# Patient Record
Sex: Female | Born: 1965 | Race: White | Hispanic: No | Marital: Single | Smoking: Current every day smoker
Health system: Southern US, Community
[De-identification: ages and names within clinical notes are randomized; demographics above are authoritative.]

## PROBLEM LIST (undated history)

## (undated) DIAGNOSIS — C539 Malignant neoplasm of cervix uteri, unspecified: Secondary | ICD-10-CM

## (undated) DIAGNOSIS — G8929 Other chronic pain: Secondary | ICD-10-CM

## (undated) DIAGNOSIS — J381 Polyp of vocal cord and larynx: Secondary | ICD-10-CM

## (undated) DIAGNOSIS — K219 Gastro-esophageal reflux disease without esophagitis: Secondary | ICD-10-CM

## (undated) DIAGNOSIS — M5030 Other cervical disc degeneration, unspecified cervical region: Secondary | ICD-10-CM

## (undated) DIAGNOSIS — I639 Cerebral infarction, unspecified: Secondary | ICD-10-CM

## (undated) DIAGNOSIS — R498 Other voice and resonance disorders: Secondary | ICD-10-CM

## (undated) DIAGNOSIS — C55 Malignant neoplasm of uterus, part unspecified: Secondary | ICD-10-CM

## (undated) DIAGNOSIS — G629 Polyneuropathy, unspecified: Secondary | ICD-10-CM

## (undated) DIAGNOSIS — Z87448 Personal history of other diseases of urinary system: Secondary | ICD-10-CM

## (undated) DIAGNOSIS — M797 Fibromyalgia: Secondary | ICD-10-CM

## (undated) DIAGNOSIS — C50919 Malignant neoplasm of unspecified site of unspecified female breast: Secondary | ICD-10-CM

## (undated) DIAGNOSIS — F419 Anxiety disorder, unspecified: Secondary | ICD-10-CM

## (undated) DIAGNOSIS — M1990 Unspecified osteoarthritis, unspecified site: Secondary | ICD-10-CM

## (undated) DIAGNOSIS — K529 Noninfective gastroenteritis and colitis, unspecified: Secondary | ICD-10-CM

## (undated) DIAGNOSIS — I10 Essential (primary) hypertension: Secondary | ICD-10-CM

## (undated) DIAGNOSIS — B1920 Unspecified viral hepatitis C without hepatic coma: Secondary | ICD-10-CM

## (undated) DIAGNOSIS — M5136 Other intervertebral disc degeneration, lumbar region: Secondary | ICD-10-CM

## (undated) HISTORY — DX: Unspecified viral hepatitis C without hepatic coma: B19.20

## (undated) HISTORY — PX: RADICAL ABDOMINAL HYSTERECTOMY: SUR659

## (undated) HISTORY — PX: SHOULDER SURGERY: SHX246

## (undated) HISTORY — DX: Other voice and resonance disorders: R49.8

## (undated) HISTORY — PX: OTHER SURGICAL HISTORY: SHX169

## (undated) HISTORY — PX: TONSILLECTOMY AND ADENOIDECTOMY: SHX28

## (undated) HISTORY — PX: BACK SURGERY: SHX140

## (undated) HISTORY — DX: Polyp of vocal cord and larynx: J38.1

## (undated) HISTORY — PX: NECK SURGERY: SHX720

## (undated) HISTORY — DX: Essential (primary) hypertension: I10

## (undated) HISTORY — PX: CARPAL TUNNEL RELEASE: SHX101

## (undated) HISTORY — DX: Cerebral infarction, unspecified: I63.9

## (undated) HISTORY — PX: TONSILLECTOMY: SUR1361

## (undated) HISTORY — DX: Anxiety disorder, unspecified: F41.9

## (undated) HISTORY — DX: Unspecified osteoarthritis, unspecified site: M19.90

## (undated) HISTORY — DX: Gastro-esophageal reflux disease without esophagitis: K21.9

## (undated) HISTORY — PX: BREAST BIOPSY: SHX20

---

## 2004-10-30 ENCOUNTER — Emergency Department (HOSPITAL_COMMUNITY): Admission: EM | Admit: 2004-10-30 | Discharge: 2004-10-30 | Payer: Self-pay | Admitting: Family Medicine

## 2005-12-18 ENCOUNTER — Ambulatory Visit: Payer: Self-pay | Admitting: Internal Medicine

## 2005-12-24 ENCOUNTER — Ambulatory Visit (HOSPITAL_COMMUNITY): Admission: RE | Admit: 2005-12-24 | Discharge: 2005-12-24 | Payer: Self-pay | Admitting: Internal Medicine

## 2006-01-12 ENCOUNTER — Encounter: Admission: RE | Admit: 2006-01-12 | Discharge: 2006-01-12 | Payer: Self-pay | Admitting: Internal Medicine

## 2006-01-19 ENCOUNTER — Ambulatory Visit: Payer: Self-pay | Admitting: Internal Medicine

## 2006-01-20 ENCOUNTER — Ambulatory Visit: Payer: Self-pay | Admitting: Internal Medicine

## 2006-01-21 ENCOUNTER — Ambulatory Visit: Payer: Self-pay | Admitting: *Deleted

## 2006-02-18 ENCOUNTER — Ambulatory Visit: Payer: Self-pay | Admitting: Internal Medicine

## 2006-03-01 ENCOUNTER — Ambulatory Visit (HOSPITAL_COMMUNITY): Admission: RE | Admit: 2006-03-01 | Discharge: 2006-03-01 | Payer: Self-pay | Admitting: Internal Medicine

## 2006-03-03 ENCOUNTER — Ambulatory Visit: Payer: Self-pay | Admitting: Internal Medicine

## 2006-03-09 ENCOUNTER — Ambulatory Visit: Payer: Self-pay | Admitting: Internal Medicine

## 2006-03-23 ENCOUNTER — Ambulatory Visit: Payer: Self-pay | Admitting: Internal Medicine

## 2006-03-24 ENCOUNTER — Ambulatory Visit (HOSPITAL_COMMUNITY): Admission: RE | Admit: 2006-03-24 | Discharge: 2006-03-24 | Payer: Self-pay | Admitting: Internal Medicine

## 2006-04-06 ENCOUNTER — Ambulatory Visit: Payer: Self-pay | Admitting: Internal Medicine

## 2006-05-19 ENCOUNTER — Ambulatory Visit: Payer: Self-pay | Admitting: Nurse Practitioner

## 2006-06-23 ENCOUNTER — Ambulatory Visit: Payer: Self-pay | Admitting: Nurse Practitioner

## 2006-07-20 ENCOUNTER — Ambulatory Visit: Payer: Self-pay | Admitting: Nurse Practitioner

## 2006-07-21 ENCOUNTER — Encounter (INDEPENDENT_AMBULATORY_CARE_PROVIDER_SITE_OTHER): Payer: Self-pay | Admitting: Nurse Practitioner

## 2006-07-26 ENCOUNTER — Ambulatory Visit (HOSPITAL_COMMUNITY): Admission: RE | Admit: 2006-07-26 | Discharge: 2006-07-26 | Payer: Self-pay | Admitting: Family Medicine

## 2006-08-10 ENCOUNTER — Encounter: Admission: RE | Admit: 2006-08-10 | Discharge: 2006-08-26 | Payer: Self-pay | Admitting: Neurosurgery

## 2006-08-18 ENCOUNTER — Other Ambulatory Visit: Admission: RE | Admit: 2006-08-18 | Discharge: 2006-08-18 | Payer: Self-pay | Admitting: Nurse Practitioner

## 2006-08-18 ENCOUNTER — Ambulatory Visit: Payer: Self-pay | Admitting: Nurse Practitioner

## 2006-09-01 ENCOUNTER — Emergency Department (HOSPITAL_COMMUNITY): Admission: EM | Admit: 2006-09-01 | Discharge: 2006-09-01 | Payer: Self-pay | Admitting: Family Medicine

## 2006-09-21 ENCOUNTER — Ambulatory Visit: Payer: Self-pay | Admitting: Nurse Practitioner

## 2006-10-13 ENCOUNTER — Ambulatory Visit: Payer: Self-pay | Admitting: Nurse Practitioner

## 2006-11-15 ENCOUNTER — Ambulatory Visit: Payer: Self-pay | Admitting: Nurse Practitioner

## 2006-12-16 ENCOUNTER — Ambulatory Visit: Payer: Self-pay | Admitting: Nurse Practitioner

## 2007-01-13 ENCOUNTER — Ambulatory Visit: Payer: Self-pay | Admitting: Nurse Practitioner

## 2007-02-18 ENCOUNTER — Ambulatory Visit: Payer: Self-pay | Admitting: Nurse Practitioner

## 2007-03-15 ENCOUNTER — Ambulatory Visit: Payer: Self-pay | Admitting: Nurse Practitioner

## 2007-04-13 ENCOUNTER — Ambulatory Visit: Payer: Self-pay | Admitting: Internal Medicine

## 2007-04-25 ENCOUNTER — Ambulatory Visit (HOSPITAL_COMMUNITY): Admission: RE | Admit: 2007-04-25 | Discharge: 2007-04-25 | Payer: Self-pay | Admitting: Nurse Practitioner

## 2007-06-14 ENCOUNTER — Encounter: Payer: Self-pay | Admitting: Nurse Practitioner

## 2007-06-14 DIAGNOSIS — F32A Depression, unspecified: Secondary | ICD-10-CM | POA: Insufficient documentation

## 2007-06-14 DIAGNOSIS — K219 Gastro-esophageal reflux disease without esophagitis: Secondary | ICD-10-CM | POA: Insufficient documentation

## 2007-06-14 DIAGNOSIS — F329 Major depressive disorder, single episode, unspecified: Secondary | ICD-10-CM

## 2007-06-14 DIAGNOSIS — Z8739 Personal history of other diseases of the musculoskeletal system and connective tissue: Secondary | ICD-10-CM | POA: Insufficient documentation

## 2007-06-14 DIAGNOSIS — IMO0002 Reserved for concepts with insufficient information to code with codable children: Secondary | ICD-10-CM | POA: Insufficient documentation

## 2007-06-14 DIAGNOSIS — M542 Cervicalgia: Secondary | ICD-10-CM | POA: Insufficient documentation

## 2007-07-06 ENCOUNTER — Ambulatory Visit: Payer: Self-pay | Admitting: Internal Medicine

## 2007-08-04 ENCOUNTER — Ambulatory Visit: Payer: Self-pay | Admitting: Internal Medicine

## 2007-12-16 ENCOUNTER — Ambulatory Visit: Payer: Self-pay | Admitting: Family Medicine

## 2008-01-13 ENCOUNTER — Emergency Department (HOSPITAL_COMMUNITY): Admission: EM | Admit: 2008-01-13 | Discharge: 2008-01-13 | Payer: Self-pay | Admitting: Emergency Medicine

## 2008-02-01 ENCOUNTER — Ambulatory Visit: Payer: Self-pay | Admitting: Family Medicine

## 2008-03-31 ENCOUNTER — Emergency Department (HOSPITAL_COMMUNITY): Admission: EM | Admit: 2008-03-31 | Discharge: 2008-04-01 | Payer: Self-pay | Admitting: Emergency Medicine

## 2008-05-07 ENCOUNTER — Ambulatory Visit: Payer: Self-pay | Admitting: Internal Medicine

## 2008-06-10 ENCOUNTER — Emergency Department (HOSPITAL_COMMUNITY): Admission: EM | Admit: 2008-06-10 | Discharge: 2008-06-10 | Payer: Self-pay | Admitting: Emergency Medicine

## 2008-06-20 IMAGING — US US ABDOMEN COMPLETE
1 series · 14 of 25 positions shown · non-contrast
Comparison: None.

CLINICAL DATA: 40 year old with epigastric/abdominal pain.
 ABDOMEN ULTRASOUND COMPLETE ? 07/26/06:
TECHNIQUE: Complete abdominal ultrasound examination was performed including evaluation of the liver, gallbladder, bile ducts, pancreas, kidneys, spleen, IVC, and abdominal aorta.

[Series 1: unknown · 0.38mm/px · 14 of 102 slices shown]
[im 1/102]
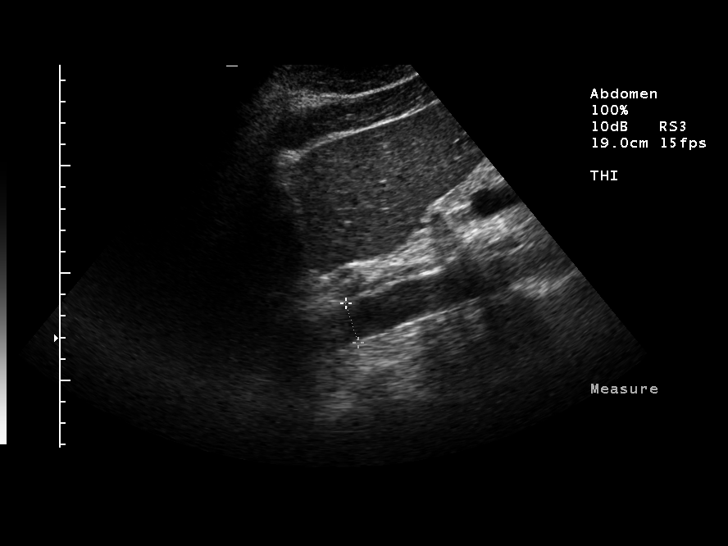
[im 9/102]
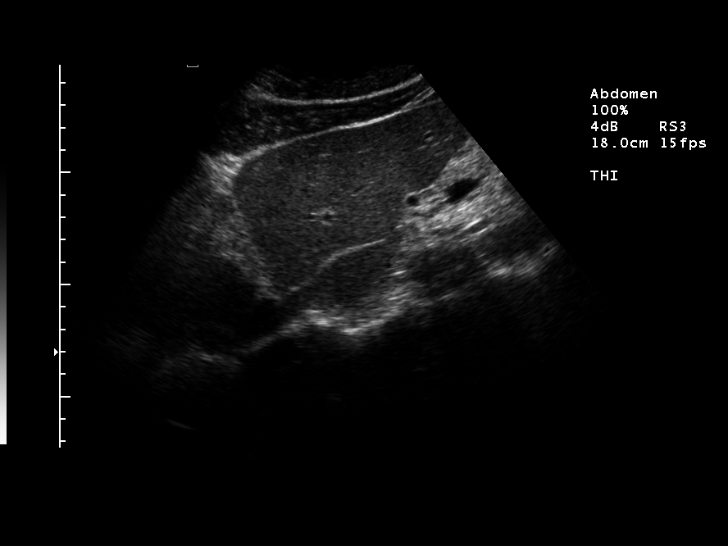
[im 17/102]
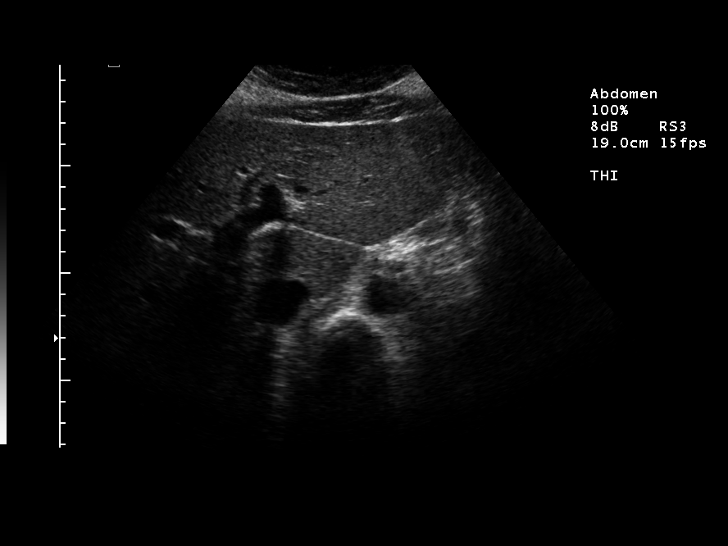
[im 26/102]
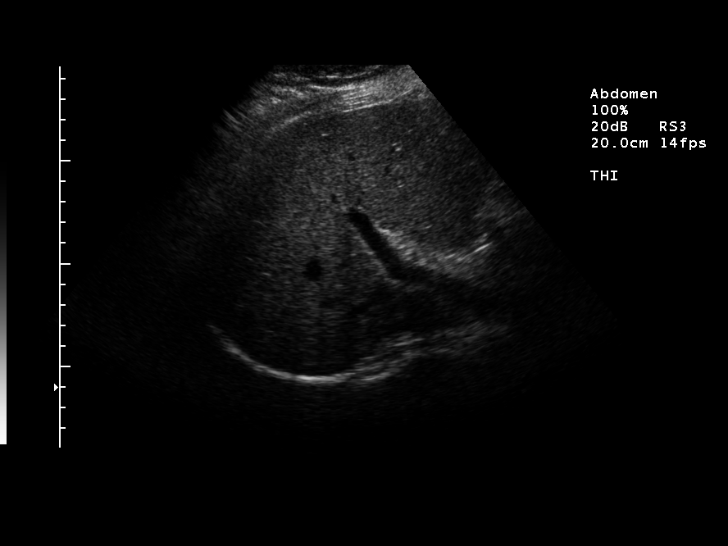
[im 34/102]
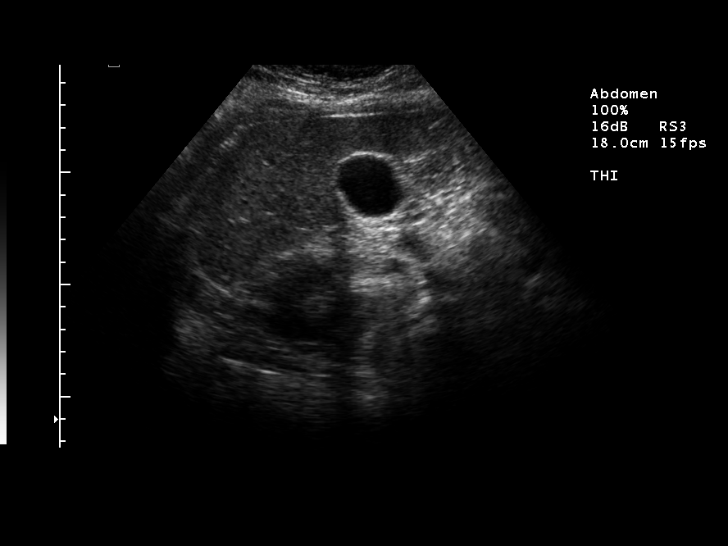
[im 38/102]
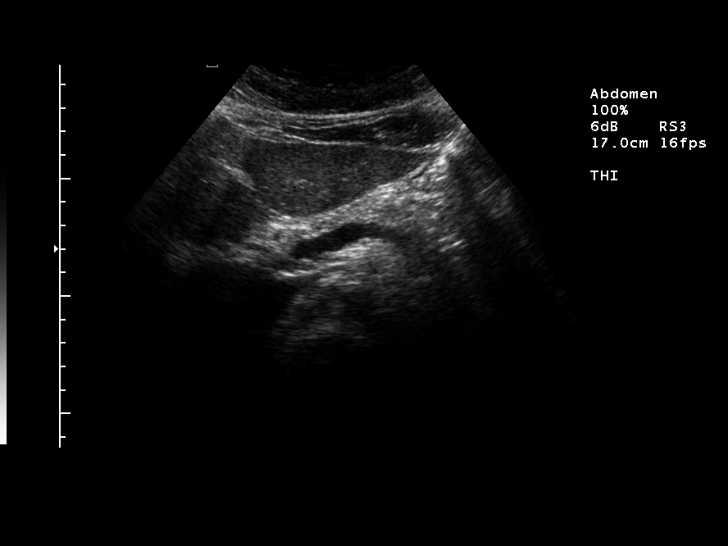
[im 47/102]
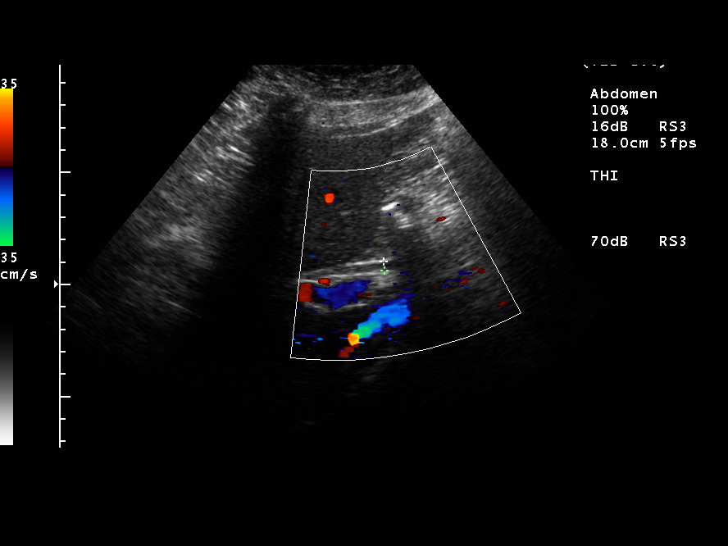
[im 55/102]
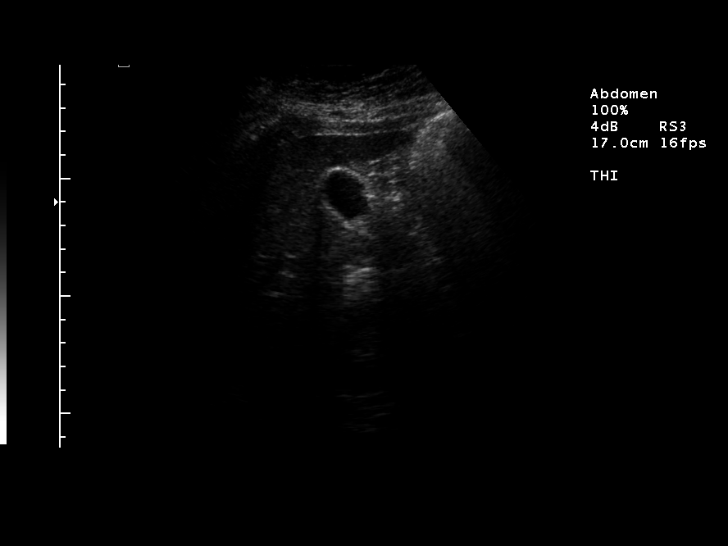
[im 64/102]
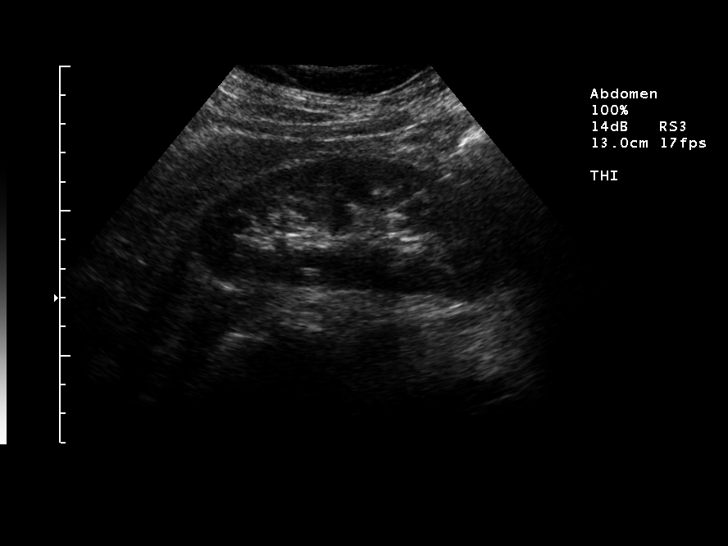
[im 68/102]
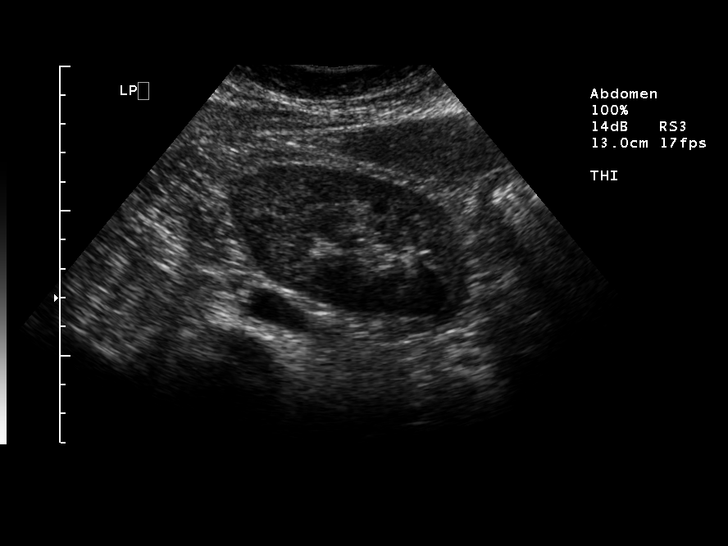
[im 76/102]
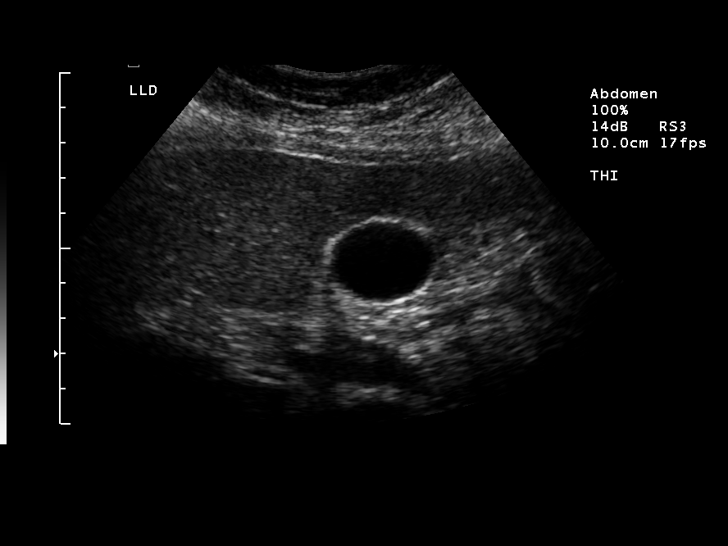
[im 85/102]
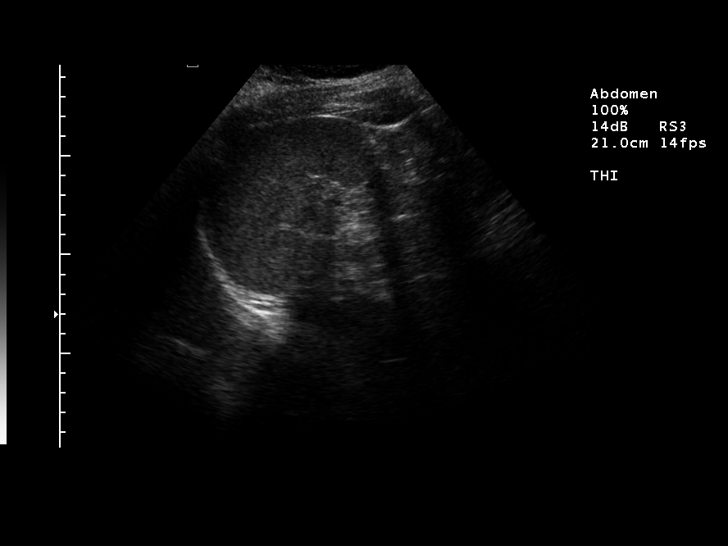
[im 93/102]
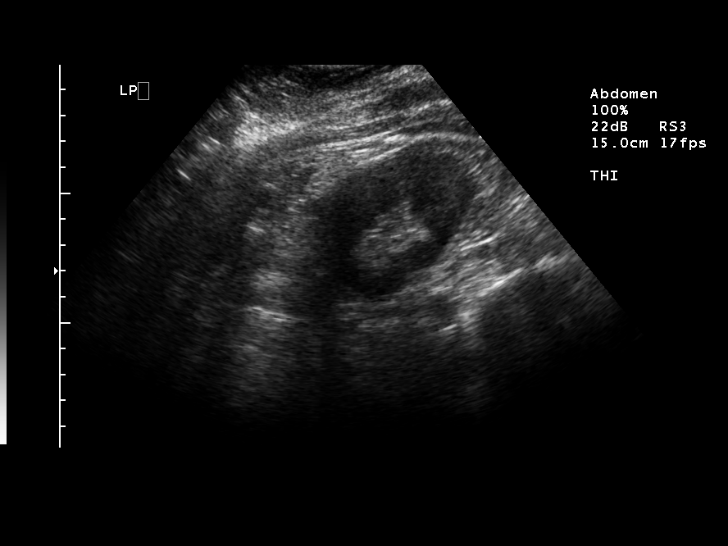
[im 102/102]
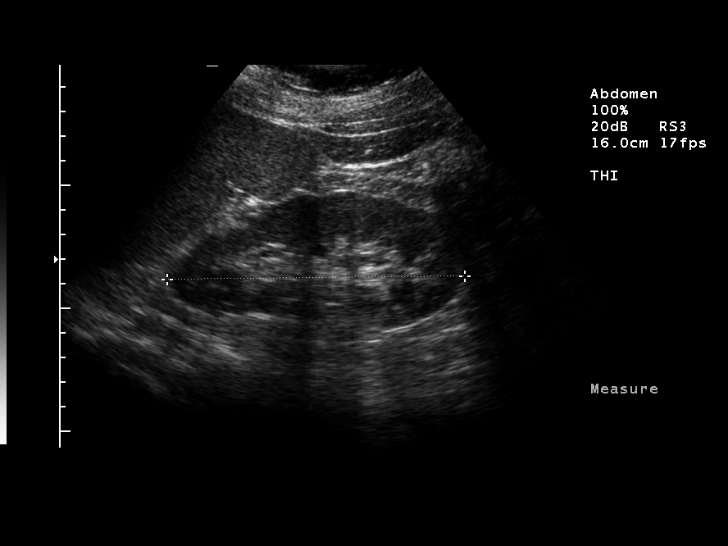

[14 of 25 positions shown; findings below may reference images not displayed]

FINDINGS: The liver is sonographically unremarkable.  There are no focal hepatic lesions or intrahepatic ductal dilatation.  The common bile duct is normal in caliber measuring 4.0 mm.  
 The gallbladder is sonographically normal.  The pancreas is fairly well visualized and demonstrates no sonographic abnormalities.  The IVC and aorta are normal in caliber.
 The spleen is normal in size measuring a maximum of 10.9 cm.  Normal echogenicity.  The right kidney measures 11.0 cm and the left kidney measures 12.1 cm.  Both kidneys demonstrate normal echogenicity without focal lesions or hydronephrosis.
IMPRESSION: Normal abdominal ultrasound examination.

## 2008-06-22 ENCOUNTER — Encounter (INDEPENDENT_AMBULATORY_CARE_PROVIDER_SITE_OTHER): Payer: Self-pay | Admitting: Family Medicine

## 2008-06-22 ENCOUNTER — Ambulatory Visit: Payer: Self-pay | Admitting: Internal Medicine

## 2008-06-22 LAB — CONVERTED CEMR LAB
Amphetamine Screen, Ur: NEGATIVE
Barbiturate Quant, Ur: NEGATIVE
Cocaine Metabolites: NEGATIVE
Marijuana Metabolite: NEGATIVE
Methadone: NEGATIVE
Opiate Screen, Urine: NEGATIVE

## 2008-07-16 ENCOUNTER — Emergency Department (HOSPITAL_COMMUNITY): Admission: EM | Admit: 2008-07-16 | Discharge: 2008-07-16 | Payer: Self-pay | Admitting: Emergency Medicine

## 2008-07-19 ENCOUNTER — Ambulatory Visit: Payer: Self-pay | Admitting: Family Medicine

## 2008-07-19 LAB — CONVERTED CEMR LAB
Amphetamine Screen, Ur: NEGATIVE
Barbiturate Quant, Ur: NEGATIVE
Marijuana Metabolite: NEGATIVE
Methadone: NEGATIVE
Opiate Screen, Urine: NEGATIVE

## 2008-07-20 ENCOUNTER — Emergency Department (HOSPITAL_COMMUNITY): Admission: EM | Admit: 2008-07-20 | Discharge: 2008-07-20 | Payer: Self-pay | Admitting: Emergency Medicine

## 2008-08-16 ENCOUNTER — Ambulatory Visit: Payer: Self-pay | Admitting: Family Medicine

## 2008-10-29 ENCOUNTER — Emergency Department (HOSPITAL_COMMUNITY): Admission: EM | Admit: 2008-10-29 | Discharge: 2008-10-29 | Payer: Self-pay | Admitting: Emergency Medicine

## 2009-05-15 ENCOUNTER — Emergency Department (HOSPITAL_COMMUNITY): Admission: EM | Admit: 2009-05-15 | Discharge: 2009-05-16 | Payer: Self-pay | Admitting: Emergency Medicine

## 2009-05-15 ENCOUNTER — Emergency Department (HOSPITAL_COMMUNITY): Admission: EM | Admit: 2009-05-15 | Discharge: 2009-05-15 | Payer: Self-pay | Admitting: Family Medicine

## 2009-05-28 ENCOUNTER — Encounter: Admission: RE | Admit: 2009-05-28 | Discharge: 2009-05-28 | Payer: Self-pay | Admitting: Family Medicine

## 2009-07-29 ENCOUNTER — Emergency Department (HOSPITAL_COMMUNITY): Admission: EM | Admit: 2009-07-29 | Discharge: 2009-07-29 | Payer: Self-pay | Admitting: Emergency Medicine

## 2009-08-09 ENCOUNTER — Emergency Department (HOSPITAL_COMMUNITY): Admission: EM | Admit: 2009-08-09 | Discharge: 2009-08-09 | Payer: Self-pay | Admitting: Emergency Medicine

## 2009-09-02 ENCOUNTER — Emergency Department (HOSPITAL_COMMUNITY): Admission: EM | Admit: 2009-09-02 | Discharge: 2009-09-02 | Payer: Self-pay | Admitting: Emergency Medicine

## 2009-09-16 ENCOUNTER — Ambulatory Visit: Payer: Self-pay | Admitting: Family Medicine

## 2009-09-16 ENCOUNTER — Emergency Department (HOSPITAL_COMMUNITY): Admission: EM | Admit: 2009-09-16 | Discharge: 2009-09-16 | Payer: Self-pay | Admitting: Emergency Medicine

## 2010-03-09 ENCOUNTER — Emergency Department (HOSPITAL_COMMUNITY): Admission: EM | Admit: 2010-03-09 | Discharge: 2010-03-09 | Payer: Self-pay | Admitting: Emergency Medicine

## 2010-11-16 ENCOUNTER — Encounter: Payer: Self-pay | Admitting: Family Medicine

## 2011-01-28 LAB — COMPREHENSIVE METABOLIC PANEL
AST: 14 U/L (ref 0–37)
CO2: 26 mEq/L (ref 19–32)
Calcium: 9 mg/dL (ref 8.4–10.5)
Creatinine, Ser: 0.73 mg/dL (ref 0.4–1.2)
GFR calc Af Amer: >60 mL/min (ref >60)
GFR calc non Af Amer: >60 mL/min (ref >60)
Total Protein: 7 g/dL (ref 6.0–8.3)

## 2011-01-28 LAB — URINALYSIS, ROUTINE W REFLEX MICROSCOPIC
Glucose, UA: NEGATIVE mg/dL
Hgb urine dipstick: NEGATIVE
Specific Gravity, Urine: 1.025 (ref 1.005–1.030)
Urobilinogen, UA: 0.2 mg/dL (ref 0.0–1.0)
pH: 5.5 (ref 5.0–8.0)

## 2011-01-28 LAB — CBC
MCHC: 34.2 g/dL (ref 30.0–36.0)
MCV: 86.6 fL (ref 78.0–100.0)
Platelets: 278 10*3/uL (ref 150–400)
RBC: 4.21 MIL/uL (ref 3.87–5.11)
RDW: 15 % (ref 11.5–15.5)

## 2011-01-28 LAB — DIFFERENTIAL
Eosinophils Relative: 1 % (ref 0–5)
Lymphocytes Relative: 25 % (ref 12–46)
Lymphs Abs: 3 10*3/uL (ref 0.7–4.0)
Monocytes Relative: 6 % (ref 3–12)

## 2011-01-28 LAB — POCT PREGNANCY, URINE: Preg Test, Ur: NEGATIVE

## 2011-02-01 LAB — BASIC METABOLIC PANEL
CO2: 28 mEq/L (ref 19–32)
Calcium: 9 mg/dL (ref 8.4–10.5)
Creatinine, Ser: 0.68 mg/dL (ref 0.4–1.2)
GFR calc Af Amer: >60 mL/min (ref >60)

## 2011-02-01 LAB — CBC
MCHC: 34.4 g/dL (ref 30.0–36.0)
RBC: 4.52 MIL/uL (ref 3.87–5.11)

## 2011-02-01 LAB — DIFFERENTIAL
Basophils Absolute: 0 10*3/uL (ref 0.0–0.1)
Basophils Relative: 0 % (ref 0–1)
Monocytes Relative: 5 % (ref 3–12)
Neutro Abs: 6.5 10*3/uL (ref 1.7–7.7)
Neutrophils Relative %: 61 % (ref 43–77)

## 2011-02-01 LAB — URINALYSIS, ROUTINE W REFLEX MICROSCOPIC
Nitrite: NEGATIVE
Specific Gravity, Urine: 1.031 — ABNORMAL HIGH (ref 1.005–1.030)
Urobilinogen, UA: 0.2 mg/dL (ref 0.0–1.0)

## 2011-02-01 LAB — URINE MICROSCOPIC-ADD ON

## 2011-02-01 LAB — GLUCOSE, CAPILLARY: Glucose-Capillary: 116 mg/dL — ABNORMAL HIGH (ref 70–99)

## 2011-02-01 LAB — URINE CULTURE

## 2011-02-01 LAB — POCT PREGNANCY, URINE: Preg Test, Ur: NEGATIVE

## 2013-01-20 ENCOUNTER — Emergency Department: Payer: Self-pay | Admitting: Emergency Medicine

## 2013-01-20 LAB — URINALYSIS, COMPLETE
Bilirubin,UR: NEGATIVE
Blood: NEGATIVE
Glucose,UR: NEGATIVE mg/dL (ref 0–75)
RBC,UR: 1 /HPF (ref 0–5)
Squamous Epithelial: 15
WBC UR: 1 /HPF (ref 0–5)

## 2013-01-20 LAB — CBC
HCT: 39.4 % (ref 35.0–47.0)
MCH: 30.1 pg (ref 26.0–34.0)
MCV: 88 fL (ref 80–100)
Platelet: 282 10*3/uL (ref 150–440)
RBC: 4.49 10*6/uL (ref 3.80–5.20)
RDW: 15.4 % — ABNORMAL HIGH (ref 11.5–14.5)
WBC: 8 10*3/uL (ref 3.6–11.0)

## 2013-01-20 LAB — COMPREHENSIVE METABOLIC PANEL
BUN: 14 mg/dL (ref 7–18)
Calcium, Total: 8.7 mg/dL (ref 8.5–10.1)
Chloride: 100 mmol/L (ref 98–107)
EGFR (African American): >60
Glucose: 77 mg/dL (ref 65–99)
Osmolality: 268 (ref 275–301)
Potassium: 4.4 mmol/L (ref 3.5–5.1)
SGOT(AST): 43 U/L — ABNORMAL HIGH (ref 15–37)
SGPT (ALT): 56 U/L (ref 12–78)
Sodium: 134 mmol/L — ABNORMAL LOW (ref 136–145)

## 2013-03-02 ENCOUNTER — Ambulatory Visit: Payer: Self-pay | Admitting: Nurse Practitioner

## 2013-03-13 ENCOUNTER — Encounter (HOSPITAL_COMMUNITY): Payer: Self-pay | Admitting: *Deleted

## 2013-03-13 ENCOUNTER — Inpatient Hospital Stay (HOSPITAL_COMMUNITY)
Admission: AD | Admit: 2013-03-13 | Discharge: 2013-03-13 | Disposition: A | Discharge status: Home / Self Care | Payer: Medicare Other | Source: Ambulatory Visit | Attending: Obstetrics & Gynecology | Admitting: Obstetrics & Gynecology

## 2013-03-13 DIAGNOSIS — K047 Periapical abscess without sinus: Secondary | ICD-10-CM | POA: Insufficient documentation

## 2013-03-13 DIAGNOSIS — K089 Disorder of teeth and supporting structures, unspecified: Secondary | ICD-10-CM | POA: Insufficient documentation

## 2013-03-13 DIAGNOSIS — R300 Dysuria: Secondary | ICD-10-CM | POA: Insufficient documentation

## 2013-03-13 HISTORY — DX: Other intervertebral disc degeneration, lumbar region: M51.36

## 2013-03-13 HISTORY — DX: Malignant neoplasm of cervix uteri, unspecified: C53.9

## 2013-03-13 HISTORY — DX: Other chronic pain: G89.29

## 2013-03-13 HISTORY — DX: Personal history of other diseases of urinary system: Z87.448

## 2013-03-13 HISTORY — DX: Malignant neoplasm of uterus, part unspecified: C55

## 2013-03-13 HISTORY — DX: Fibromyalgia: M79.7

## 2013-03-13 HISTORY — DX: Gastro-esophageal reflux disease without esophagitis: K21.9

## 2013-03-13 HISTORY — DX: Noninfective gastroenteritis and colitis, unspecified: K52.9

## 2013-03-13 HISTORY — DX: Other cervical disc degeneration, unspecified cervical region: M50.30

## 2013-03-13 HISTORY — DX: Malignant neoplasm of unspecified site of unspecified female breast: C50.919

## 2013-03-13 HISTORY — DX: Polyneuropathy, unspecified: G62.9

## 2013-03-13 LAB — URINALYSIS, ROUTINE W REFLEX MICROSCOPIC
Bilirubin Urine: NEGATIVE
Glucose, UA: NEGATIVE mg/dL
Hgb urine dipstick: NEGATIVE
Ketones, ur: NEGATIVE mg/dL
Nitrite: NEGATIVE
Specific Gravity, Urine: >1.03 — ABNORMAL HIGH (ref 1.005–1.030)
pH: 5.5 (ref 5.0–8.0)

## 2013-03-13 MED ORDER — AMOXICILLIN 250 MG PO CAPS: 250.0000 mg | ORAL_CAPSULE | Freq: Three times a day (TID) | ORAL | Status: DC

## 2013-03-13 NOTE — MAU Provider Note (Signed)
Attestation of Attending Supervision of Advanced Practitioner (CNM/NP): Evaluation and management procedures were performed by the Advanced Practitioner under my supervision and collaboration. I have reviewed the Advanced Practitioner's note and chart, and I agree with the management and plan.  Keylin Podolsky H. 4:18 PM

## 2013-03-13 NOTE — MAU Note (Signed)
Pt states has abscess on her r upper side, and notes burning with voiding.
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
## 2013-05-25 ENCOUNTER — Emergency Department: Payer: Self-pay | Admitting: Emergency Medicine

## 2013-12-05 ENCOUNTER — Ambulatory Visit: Payer: Medicare Other | Admitting: Family Medicine

## 2014-03-18 ENCOUNTER — Encounter (HOSPITAL_COMMUNITY): Payer: Self-pay | Admitting: Emergency Medicine

## 2014-03-18 ENCOUNTER — Emergency Department (HOSPITAL_COMMUNITY)
Admission: EM | Admit: 2014-03-18 | Discharge: 2014-03-19 | Disposition: A | Discharge status: Home / Self Care | Payer: Medicare Other | Attending: Emergency Medicine | Admitting: Emergency Medicine

## 2014-03-18 DIAGNOSIS — L03818 Cellulitis of other sites: Principal | ICD-10-CM

## 2014-03-18 DIAGNOSIS — M51379 Other intervertebral disc degeneration, lumbosacral region without mention of lumbar back pain or lower extremity pain: Secondary | ICD-10-CM | POA: Insufficient documentation

## 2014-03-18 DIAGNOSIS — G8929 Other chronic pain: Secondary | ICD-10-CM | POA: Insufficient documentation

## 2014-03-18 DIAGNOSIS — L02811 Cutaneous abscess of head [any part, except face]: Secondary | ICD-10-CM

## 2014-03-18 DIAGNOSIS — Z853 Personal history of malignant neoplasm of breast: Secondary | ICD-10-CM | POA: Insufficient documentation

## 2014-03-18 DIAGNOSIS — L02818 Cutaneous abscess of other sites: Secondary | ICD-10-CM | POA: Insufficient documentation

## 2014-03-18 DIAGNOSIS — K219 Gastro-esophageal reflux disease without esophagitis: Secondary | ICD-10-CM | POA: Insufficient documentation

## 2014-03-18 DIAGNOSIS — F17200 Nicotine dependence, unspecified, uncomplicated: Secondary | ICD-10-CM | POA: Insufficient documentation

## 2014-03-18 DIAGNOSIS — R51 Headache: Secondary | ICD-10-CM | POA: Insufficient documentation

## 2014-03-18 DIAGNOSIS — IMO0001 Reserved for inherently not codable concepts without codable children: Secondary | ICD-10-CM | POA: Insufficient documentation

## 2014-03-18 DIAGNOSIS — Z791 Long term (current) use of non-steroidal anti-inflammatories (NSAID): Secondary | ICD-10-CM | POA: Insufficient documentation

## 2014-03-18 DIAGNOSIS — Z79899 Other long term (current) drug therapy: Secondary | ICD-10-CM | POA: Insufficient documentation

## 2014-03-18 DIAGNOSIS — Z792 Long term (current) use of antibiotics: Secondary | ICD-10-CM | POA: Insufficient documentation

## 2014-03-18 DIAGNOSIS — Z87448 Personal history of other diseases of urinary system: Secondary | ICD-10-CM | POA: Insufficient documentation

## 2014-03-18 DIAGNOSIS — M5030 Other cervical disc degeneration, unspecified cervical region: Secondary | ICD-10-CM | POA: Insufficient documentation

## 2014-03-18 DIAGNOSIS — G589 Mononeuropathy, unspecified: Secondary | ICD-10-CM | POA: Insufficient documentation

## 2014-03-18 DIAGNOSIS — Z8541 Personal history of malignant neoplasm of cervix uteri: Secondary | ICD-10-CM | POA: Insufficient documentation

## 2014-03-18 DIAGNOSIS — M5137 Other intervertebral disc degeneration, lumbosacral region: Secondary | ICD-10-CM | POA: Insufficient documentation

## 2014-03-18 MED ORDER — HYDROMORPHONE HCL PF 1 MG/ML IJ SOLN
2.0000 mg | Freq: Once | INTRAMUSCULAR | Status: AC
  Administered 2014-03-18: 2 mg via INTRAMUSCULAR
  Filled 2014-03-18: qty 2

## 2014-03-18 MED ORDER — CEPHALEXIN 500 MG PO CAPS: 500.0000 mg | ORAL_CAPSULE | Freq: Four times a day (QID) | ORAL | Status: DC

## 2014-03-18 NOTE — ED Notes (Addendum)
C/o "knot" on L side of head x 1 week.  States she is having throbbing pain to L side of head.  Seen by PCP office last Friday and told to use Head and Shoulders.  States she has been using Head and Shoulders and T-Gel and symptoms have worsened.

## 2014-03-18 NOTE — ED Provider Notes (Signed)
CSN: [id number]     Arrival date & time 03/18/14  2041 History   None   This chart was scribed for Malvin Johns, MD by Terressa Koyanagi, ED Scribe. This patient was seen in room TR06C/TR06C and the patient's care was started at 10:56 PM.  Chief Complaint  Patient presents with  . Abscess   The history is provided by the patient. No language interpreter was used.   HPI Comments: Eileen Douglas is a 48 y.o. female, with a history of uterine cancer, cervical cancer, and breast cancer, who presents to the Emergency Department complaining of an abscess in her head with associated worsening head pain onset one week ago. Pt reports she is having throbbing pain to the left side of her head. Pt states she saw a provider at her PCP's office 2 days ago and was told to use Head and Shoulders. Pt reports she has been using Head and Shoulders and T-Gel, however, her Sx continue to worsen. Pt denies any Hx of similar Sx. Pt denies any drug allergies.   Past Medical History  Diagnosis Date  . Uterine cancer   . Cervical cancer   . H/O bladder problems   . Fibromyalgia   . 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 Chronic pain   . Degenerative disc disease, cervical   . Degenerative disc disease, lumbar   . Colitis   . Breast cancer   . Acid reflux    Past Surgical History  Procedure Laterality Date  . Radical abdominal hysterectomy    . Tonsillectomy    . Tonsillectomy and adenoidectomy    . Breast biopsy    . Tumor removed from throat    . Shoulder surgery    . Carpal tunnel release     No family history on file. History  Substance Use Topics  . Smoking status: Current Every Day Smoker  . Smokeless tobacco: Never Used  . Alcohol Use: No   OB History   Grav Para Term Preterm Abortions TAB SAB Ect Mult Living   2 1 1  1  1   1      Review of Systems  Constitutional: Negative for fever.  HENT:       Left sided, throbbing head pain  Skin:       Abscess in head.       Allergies  Review  of patient's allergies indicates no known allergies.  Home Medications   Prior to Admission medications   Medication Sig Start Date End Date Taking? Authorizing Provider  ALPRAZolam Duanne Moron) 1 MG tablet Take 1 mg by mouth 2 (two) times daily as needed for anxiety.    Historical Provider, MD  amoxicillin (AMOXIL) 250 MG capsule Take 1 capsule (250 mg total) by mouth 3 (three) times daily. 03/13/13   Heather Erby Pian, CNM  esomeprazole (NEXIUM) 40 MG capsule Take 40 mg by mouth 2 (two) times daily.    Historical Provider, MD  gabapentin (NEURONTIN) 100 MG capsule Take 200 mg by mouth 3 (three) times daily.    Historical Provider, MD  HYDROcodone-acetaminophen (NORCO) 7.5-325 MG per tablet Take 1 tablet by mouth once.    Historical Provider, MD  methocarbamol (ROBAXIN) 750 MG tablet Take 750 mg by mouth 2 (two) times daily.    Historical Provider, MD  naproxen sodium (ANAPROX) 220 MG tablet Take 440 mg by mouth 2 (two) times daily with a meal.    Historical Provider, MD  promethazine-dextromethorphan (PROMETHAZINE-DM) 6.25-15  MG/5ML syrup Take 5 mLs by mouth 4 (four) times daily as needed for cough.    Historical Provider, MD  traZODone (DESYREL) 150 MG tablet Take 150 mg by mouth at bedtime.    Historical Provider, MD   Triage Vitals: BP 154/96  Pulse 96  Temp(Src) 98.3 F (36.8 C) (Oral)  Resp 20  Ht 5\' 10"  (1.778 m)  Wt 217 lb 5 oz (98.572 kg)  BMI 31.18 kg/m2  SpO2 99% Physical Exam  Nursing note and vitals reviewed. Constitutional: She is oriented to person, place, and time. She appears well-developed and well-nourished. No distress.  HENT:  Head: Normocephalic and atraumatic.  Eyes: EOM are normal.  Neck: Neck supple. No tracheal deviation present.  Cardiovascular: Normal rate.   Pulmonary/Chest: Effort normal. No respiratory distress.  Musculoskeletal: Normal range of motion.  Neurological: She is alert and oriented to person, place, and time.  Skin: Skin is warm and dry.   Abscess in scalp with central crusting with erythema, induration of scalp. Posterior adenopathy.   Psychiatric: She has a normal mood and affect. Her behavior is normal.    ED Course  NERVE BLOCK Date/Time: 03/18/2014 11:12 PM Performed by: Margarita Mail Authorized by: Margarita Mail Consent: Verbal consent obtained. Risks and benefits: risks, benefits and alternatives were discussed Consent given by: patient Body area: head Nerve: occipital Laterality: left Patient sedated: no Preparation: Patient was prepped and draped in the usual sterile fashion. Patient position: sitting Needle gauge: 38 G Location technique: anatomical landmarks Local anesthetic: lidocaine 2% with epinephrine Anesthetic total: 4 ml Outcome: pain improved Patient tolerance: Patient tolerated the procedure well with no immediate complications.     INCISION AND DRAINAGE Performed by: Margarita Mail Consent: Verbal consent obtained. Risks and benefits: risks, benefits and alternatives were discussed Type: abscess  Body area: sclap  Anesthesia: local infiltration  Incision was made with an 11 blade scalpel.  Local anesthetic: lidocaine 2% w epinephrine  Anesthetic total: 4 ml  Complexity: complex Blunt dissection to break up loculations  Drainage: purulent  Drainage amount: copious   Patient tolerance: Patient tolerated the procedure well with no immediate complications.     (including critical care time) DIAGNOSTIC STUDIES: Oxygen Saturation is 99% on RA, normal by my interpretation.    COORDINATION OF CARE: 11:02 PM-Discussed treatment plan which includes nerve block, meds, and labs with pt at bedside and pt agreed to plan.      Labs Review Labs Reviewed - No data to display  Imaging Review No results found.   EKG Interpretation None      MDM   Final diagnoses:  Scalp abscess    Patient with abscess of the scalp. I&D performed with copious flushing. Too small  for packing. Difficult to assess surrounding tissue due to hair on the scalp. D/c with keflex. Patient under pain contract. Return precutions discussed.  I personally performed the services described in this documentation, which was scribed in my presence. The recorded information has been reviewed and is accurate.     Margarita Mail, PA-C 03/19/14 2021

## 2014-03-18 NOTE — Discharge Instructions (Signed)
Abscess  Care After  An abscess (also called a boil or furuncle) is an infected area that contains a collection of pus. Signs and symptoms of an abscess include pain, tenderness, redness, or hardness, or you may feel a moveable soft area under your skin. An abscess can occur anywhere in the body. The infection may spread to surrounding tissues causing cellulitis. A cut (incision) by the surgeon was made over your abscess and the pus was drained out. Gauze may have been packed into the space to provide a drain that will allow the cavity to heal from the inside outwards. The boil may be painful for 5 to 7 days. Most people with a boil do not have high fevers. Your abscess, if seen early, may not have localized, and may not have been lanced. If not, another appointment may be required for this if it does not get better on its own or with medications.  HOME CARE INSTRUCTIONS   · Only take over-the-counter or prescription medicines for pain, discomfort, or fever as directed by your caregiver.  · When you bathe, soak and then remove gauze or iodoform packs at least daily or as directed by your caregiver. You may then wash the wound gently with mild soapy water. Repack with gauze or do as your caregiver directs.  SEEK IMMEDIATE MEDICAL CARE IF:   · You develop increased pain, swelling, redness, drainage, or bleeding in the wound site.  · You develop signs of generalized infection including muscle aches, chills, fever, or a general ill feeling.  · An oral temperature above 102° F (38.9° C) develops, not controlled by medication.  See your caregiver for a recheck if you develop any of the symptoms described above. If medications (antibiotics) were prescribed, take them as directed.  Document Released: 04/30/2005 Document Revised: 01/04/2012 Document Reviewed: 12/26/2007  [employer]® Patient Information ©2014 [employer].

## 2014-03-21 ENCOUNTER — Telehealth (HOSPITAL_BASED_OUTPATIENT_CLINIC_OR_DEPARTMENT_OTHER): Payer: Self-pay | Admitting: Emergency Medicine

## 2014-03-21 LAB — CULTURE, ROUTINE-ABSCESS: GRAM STAIN: NONE SEEN

## 2014-03-21 NOTE — Progress Notes (Signed)
ED Antimicrobial Stewardship Positive Culture Follow Up   Eileen Douglas is an 48 y.o. female who presented to [facility] on 03/18/2014 with a chief complaint of  Chief Complaint  Patient presents with  . Abscess    Recent Results (from the past 720 hour(s))  CULTURE, ROUTINE-ABSCESS     Status: None   Collection Time    03/18/14 11:24 PM      Result Value Ref Range Status   Specimen Description ABSCESS   Final   Special Requests HEAD   Final   Gram Stain     Final   Value: NO WBC SEEN     NO SQUAMOUS EPITHELIAL CELLS SEEN     RARE GRAM POSITIVE COCCI     IN PAIRS IN CLUSTERS     Performed at [facility]   Culture     Final   Value: ABUNDANT METHICILLIN RESISTANT STAPHYLOCOCCUS AUREUS     Note: RIFAMPIN AND GENTAMICIN SHOULD NOT BE USED AS SINGLE DRUGS FOR TREATMENT OF STAPH INFECTIONS. This organism DOES NOT demonstrate inducible Clindamycin resistance in vitro. CRITICAL RESULT CALLED TO, READ BACK BY AND VERIFIED WITH: PATTY C 5/27 @      900 BY REAMM     Performed at [facility]   Report Status 03/21/2014 FINAL   Final   Organism ID, Bacteria METHICILLIN RESISTANT STAPHYLOCOCCUS AUREUS   Final    [x]  Treated with Keflex, organism resistant to prescribed antimicrobial []  Patient discharged originally without antimicrobial agent and treatment is now indicated  New antibiotic prescription: Bactrim DS 1 tab PO BID for 7 days  ED Provider: Alvina Chou, PA-C   Jeronimo Norma 03/21/2014, 10:42 AM Infectious Diseases Pharmacist Phone# [phone]

## 2014-03-21 NOTE — Telephone Encounter (Signed)
Post ED Visit - Positive Culture Follow-up: Successful Patient Follow-Up  Culture assessed and recommendations reviewed by: []  Wes Wheaton, Pharm.D., BCPS []  Heide Guile, Pharm.D., BCPS []  Alycia Rossetti, Pharm.D., BCPS []  Harper Woods, Pharm.D., BCPS, AAHIVP []  Legrand Como, Pharm.D., BCPS, AAHIVP [x]  Assunta Curtis, Pharm.D.  Positive abscess culture  []  Patient discharged without antimicrobial prescription and treatment is now indicated [x]  Organism is resistant to prescribed ED discharge antimicrobial []  Patient with positive blood cultures  Changes discussed with ED provider: Alvina Chou PA-C  New antibiotic prescription: Bactrim DS 1 tab PO BID x 7 days    [facility] 03/21/2014, 2:03 PM

## 2014-03-21 NOTE — Telephone Encounter (Signed)
Unable to contact patient via phone. Sent letter. °

## 2014-03-22 NOTE — ED Provider Notes (Signed)
Medical screening examination/treatment/procedure(s) were performed by non-physician practitioner and as supervising physician I was immediately available for consultation/collaboration.   EKG Interpretation None        Elster Corbello, MD 03/22/14 1056 

## 2014-04-11 ENCOUNTER — Encounter: Payer: Self-pay | Admitting: Internal Medicine

## 2014-04-19 ENCOUNTER — Telehealth (HOSPITAL_BASED_OUTPATIENT_CLINIC_OR_DEPARTMENT_OTHER): Payer: Self-pay | Admitting: Emergency Medicine

## 2014-06-19 ENCOUNTER — Ambulatory Visit: Payer: Medicare Other | Admitting: Internal Medicine

## 2014-06-19 ENCOUNTER — Telehealth: Payer: Self-pay

## 2014-06-19 NOTE — Telephone Encounter (Signed)
Called and spoke with patient, she apologized for missing her appointment today.  Paperwork got misplaced and fiance had surgery complications and she had to take him to the Dr. Today.  She has not slept and she requested I call her back tomorrow AM to reschedule her appointment.  I will do that.

## 2014-06-20 NOTE — Telephone Encounter (Signed)
Got patient's voice mail on her cell # and left her a detailed message to call us back to reschedule.

## 2015-05-04 ENCOUNTER — Emergency Department (HOSPITAL_COMMUNITY)
Admission: EM | Admit: 2015-05-04 | Discharge: 2015-05-04 | Disposition: A | Discharge status: Home / Self Care | Payer: Medicaid Other | Attending: Emergency Medicine | Admitting: Emergency Medicine

## 2015-05-04 ENCOUNTER — Encounter (HOSPITAL_COMMUNITY): Payer: Self-pay | Admitting: Emergency Medicine

## 2015-05-04 DIAGNOSIS — F419 Anxiety disorder, unspecified: Secondary | ICD-10-CM | POA: Diagnosis not present

## 2015-05-04 DIAGNOSIS — K219 Gastro-esophageal reflux disease without esophagitis: Secondary | ICD-10-CM | POA: Diagnosis not present

## 2015-05-04 DIAGNOSIS — R51 Headache: Secondary | ICD-10-CM | POA: Insufficient documentation

## 2015-05-04 DIAGNOSIS — H0289 Other specified disorders of eyelid: Secondary | ICD-10-CM

## 2015-05-04 DIAGNOSIS — G8929 Other chronic pain: Secondary | ICD-10-CM | POA: Diagnosis not present

## 2015-05-04 DIAGNOSIS — I10 Essential (primary) hypertension: Secondary | ICD-10-CM | POA: Diagnosis not present

## 2015-05-04 DIAGNOSIS — Z79899 Other long term (current) drug therapy: Secondary | ICD-10-CM | POA: Diagnosis not present

## 2015-05-04 DIAGNOSIS — Z8739 Personal history of other diseases of the musculoskeletal system and connective tissue: Secondary | ICD-10-CM | POA: Diagnosis not present

## 2015-05-04 DIAGNOSIS — Z853 Personal history of malignant neoplasm of breast: Secondary | ICD-10-CM | POA: Diagnosis not present

## 2015-05-04 DIAGNOSIS — H539 Unspecified visual disturbance: Secondary | ICD-10-CM | POA: Diagnosis not present

## 2015-05-04 DIAGNOSIS — Z720 Tobacco use: Secondary | ICD-10-CM | POA: Insufficient documentation

## 2015-05-04 DIAGNOSIS — H578 Other specified disorders of eye and adnexa: Secondary | ICD-10-CM | POA: Diagnosis present

## 2015-05-04 DIAGNOSIS — Z8542 Personal history of malignant neoplasm of other parts of uterus: Secondary | ICD-10-CM | POA: Diagnosis not present

## 2015-05-04 DIAGNOSIS — H5711 Ocular pain, right eye: Secondary | ICD-10-CM | POA: Diagnosis not present

## 2015-05-04 DIAGNOSIS — Z8541 Personal history of malignant neoplasm of cervix uteri: Secondary | ICD-10-CM | POA: Diagnosis not present

## 2015-05-04 DIAGNOSIS — Z8709 Personal history of other diseases of the respiratory system: Secondary | ICD-10-CM | POA: Diagnosis not present

## 2015-05-04 DIAGNOSIS — H53149 Visual discomfort, unspecified: Secondary | ICD-10-CM | POA: Insufficient documentation

## 2015-05-04 NOTE — ED Provider Notes (Signed)
CSN: [id number]     Arrival date & time 05/04/15  1207 History  This chart was scribed for Waynetta Pean, PA-C, working with Serita Grit, MD by Steva Colder, ED Scribe. The patient was seen in room TR04C/TR04C at 1:10 PM.     Chief Complaint  Patient presents with  . Eye Problem      The history is provided by the patient. No language interpreter was used.    Eileen Douglas is a 49 y.o. female with a medical hx of HTN who presents to the Emergency Department complaining of right eye problem onset 6 weeks. Pt notes that she has had a bump above her right eyelid that is painful. Pt rates her right eye pain as 9.5/10. Pt is having associated symptoms of blurred vision, and photophobia for the past 6 weeks due to this painful lump. She notes that she has tried warm compresses with no relief of her symptoms. She denies pain to her right eye, only pain to her eyelid. She denies double vision, fever, chills, ear pain, eye discharge, sore throat, cough, SOB, wheezing, and any other symptoms.    Past Medical History  Diagnosis Date  . Uterine cancer   . Cervical cancer   . H/O bladder problems   . Fibromyalgia   . 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 Chronic pain   . Degenerative disc disease, cervical   . Degenerative disc disease, lumbar   . Colitis   . Breast cancer   . Acid reflux   . GERD (gastroesophageal reflux disease)   . Anxiety   . Arthritis   . Hypertension   . Polyp of vocal cord and larynx   . Other voice and resonance disorders   . Laryngopharyngeal reflux (LPR)    Past Surgical History  Procedure Laterality Date  . Radical abdominal hysterectomy    . Tonsillectomy    . Tonsillectomy and adenoidectomy    . Breast biopsy    . Tumor removed from throat    . Shoulder surgery    . Carpal tunnel release    . Back surgery    . Neck surgery    . Direct laryngoscopy with stripping of vocal cords     Family History  Problem Relation Age of Onset  . Allergic rhinitis  Daughter   . Heart disease Father   . Other Father     Malignant neoplasm  . Other Brother     Malignant neoplasm  . Mental retardation    . Migraines    . Transient ischemic attack Mother    History  Substance Use Topics  . Smoking status: Current Every Day Smoker  . Smokeless tobacco: Never Used  . Alcohol Use: No   OB History    Gravida Para Term Preterm AB TAB SAB Ectopic Multiple Living   2 1 1  1  1   1      Review of Systems  Constitutional: Negative for fever and chills.  HENT: Negative for ear pain, mouth sores and sore throat.   Eyes: Positive for photophobia and visual disturbance. Negative for pain, discharge, redness and itching.       Lump above right eye  Respiratory: Negative for cough, shortness of breath and wheezing.   Skin: Negative for rash.  Neurological: Positive for headaches. Negative for dizziness, syncope and light-headedness.      Allergies  Hydrocodone  Home Medications   Prior to Admission medications   Medication Sig  Start Date End Date Taking? Authorizing Provider  albuterol (PROVENTIL HFA;VENTOLIN HFA) 108 (90 BASE) MCG/ACT inhaler Inhale 2 puffs into the lungs every 6 (six) hours as needed for wheezing or shortness of breath.    Historical Provider, MD  ALPRAZolam Duanne Moron) 1 MG tablet Take 1 mg by mouth 2 (two) times daily as needed for anxiety.    Historical Provider, MD  cephALEXin (KEFLEX) 500 MG capsule Take 1 capsule (500 mg total) by mouth 4 (four) times daily. 03/18/14   Margarita Mail, PA-C  lisinopril (PRINIVIL,ZESTRIL) 20 MG tablet Take 20 mg by mouth daily.    Historical Provider, MD  methocarbamol (ROBAXIN) 500 MG tablet Take 500 mg by mouth 2 (two) times daily.    Historical Provider, MD  morphine (MSIR) 15 MG tablet Take 15 mg by mouth 3 (three) times daily as needed for severe pain.    Historical Provider, MD  omeprazole (PRILOSEC) 20 MG capsule Take 20 mg by mouth daily.    Historical Provider, MD  tiZANidine (ZANAFLEX) 4  MG tablet Take 4 mg by mouth every 6 (six) hours as needed for muscle spasms.    Historical Provider, MD  trazodone (DESYREL) 300 MG tablet Take 300 mg by mouth at bedtime.    Historical Provider, MD   BP 111/52 mmHg  Pulse 74  Temp(Src) 97.8 F (36.6 C) (Oral)  Resp 18  Wt 217 lb 5 oz (98.572 kg)  SpO2 97% Physical Exam  Constitutional: She appears well-developed and well-nourished. No distress.  Nontoxic appearing.  HENT:  Head: Normocephalic and atraumatic.  Right Ear: External ear normal.  Left Ear: External ear normal.  Mouth/Throat: Oropharynx is clear and moist. No oropharyngeal exudate.  Bilateral tympanic membranes are pearly-gray without erythema or loss of landmarks.   Eyes: Conjunctivae and EOM are normal. Pupils are equal, round, and reactive to light. Lids are everted and swept, no foreign bodies found. Right eye exhibits no discharge and no hordeolum. No foreign body present in the right eye. Left eye exhibits no discharge and no hordeolum. No foreign body present in the left eye. Right conjunctiva is not injected. Left conjunctiva is not injected.  Over the pt right upper eyelid there is a small 0.5 cm mass with no overlying erythema. Pt is able to open her eyelids fully without difficulty. Soft orbits with no injected conjunctiva. EOMs intact. The mass is not near the sebaceous glands. Eyelid inverted and no inner eyelid erythema or discharge. No eye discharge. No evidence of infection.   Pulmonary/Chest: Effort normal. No respiratory distress.  Neurological: She is alert. Coordination normal.  Skin: No rash noted. She is not diaphoretic.  Psychiatric: She has a normal mood and affect. Her behavior is normal.  Nursing note and vitals reviewed.   ED Course  Procedures (including critical care time) DIAGNOSTIC STUDIES: Oxygen Saturation is 99% on RA, nl by my interpretation.    COORDINATION OF CARE: 1:16 PM-Discussed treatment plan which includes referral to  opthalmologist with pt at bedside and pt agreed to plan.   Labs Review Labs Reviewed - No data to display  Imaging Review No results found.   EKG Interpretation None      Filed Vitals:   05/04/15 1218 05/04/15 1218 05/04/15 1335  BP: 130/52  111/52  Pulse: 81  74  Temp: 97.7 F (36.5 C)  97.8 F (36.6 C)  TempSrc: Oral  Oral  Resp:   18  Weight:  217 lb 5 oz (98.572 kg)   SpO2:  99%  97%     MDM   Final diagnoses:  Eyelid pain, right   This is a 49 year old female who presents the emergency department complaining of 6 weeks of a lump over her right upper eyelid associated with blurry vision, and upper lid pain. On exam the patient is afebrile and nontoxic appearing. The patient has a small 5 mm mass over her right upper eyelid. This is not involving the sebaceous glands and does not appear to be a stye or hordeolum or chalazion. The patient's eyelid was inverted and there is no underlying erythema or discharge noted. She is discharged her eye. Her eyes are soft. There is no conjunctival injection. She denies eye pain, just eyelid pain. Patient takes chronic narcotic pain medicines for chronic pain. The patient's symptoms have been ongoing for the past 6 weeks. As this does not appear to be a stye, I feel the patient needs to follow-up with an ophthalmologist this Monday. I advised she can continue to using her pain medicines for pain and warm compresses. I gave patient referral for follow-up with ophthalmologist Dr. Katy Fitch. I advised the patient to follow-up with their primary care provider this week. I advised the patient to return to the emergency department with new or worsening symptoms or new concerns. The patient verbalized understanding and agreement with plan.    This patient was discussed with Dr. Doy Mince who agrees with assessment and plan.  I personally performed the services described in this documentation, which was scribed in my presence. The recorded information has  been reviewed and is accurate.    Waynetta Pean, PA-C 05/04/15 1723  Serita Grit, MD 05/05/15 [phone]

## 2015-05-04 NOTE — Discharge Instructions (Signed)
Please continue using warm compresses and pain medications at home. Please follow-up with ophthalmologist Dr. Leanne Lovely.    Blurred Vision You have been seen today complaining of blurred vision. This means you have a loss of ability to see small details.  CAUSES  Blurred vision can be a symptom of underlying eye problems, such as:  Aging of the eye (presbyopia).  Glaucoma.  Cataracts.  Eye infection.  Eye-related migraine.  Diabetes mellitus.  Fatigue.  Migraine headaches.  High blood pressure.  Breakdown of the back of the eye (macular degeneration).  Problems caused by some medications. The most common cause of blurred vision is the need for eyeglasses or a new prescription. Today in the emergency department, no cause for your blurred vision can be found. SYMPTOMS  Blurred vision is the loss of visual sharpness and detail (acuity). DIAGNOSIS  Should blurred vision continue, you should see your caregiver. If your caregiver is your primary care physician, he or she may choose to refer you to another specialist.  TREATMENT  Do not ignore your blurred vision. Make sure to have it checked out to see if further treatment or referral is necessary. SEEK MEDICAL CARE IF:  You are unable to get into a specialist so we can help you with a referral. SEEK IMMEDIATE MEDICAL CARE IF: You have severe eye pain, severe headache, or sudden loss of vision. MAKE SURE YOU:   Understand these instructions.  Will watch your condition.  Will get help right away if you are not doing well or get worse. Document Released: 10/15/2003 Document Revised: 01/04/2012 Document Reviewed: 05/16/2008 [facility] Patient Information 2015 [place], [place]. This information is not intended to replace advice given to you by your health care provider. Make sure you discuss any questions you have with your health care provider.  General Headache Without Cause A general headache is pain or discomfort felt around  the head or neck area. The cause may not be found.  HOME CARE   Keep all doctor visits.  Only take medicines as told by your doctor.  Lie down in a dark, quiet room when you have a headache.  Keep a journal to find out if certain things bring on headaches. For example, write down:  What you eat and drink.  How much sleep you get.  Any change to your diet or medicines.  Relax by getting a massage or doing other relaxing activities.  Put ice or heat packs on the head and neck area as told by your doctor.  Lessen stress.  Sit up straight. Do not tighten (tense) your muscles.  Quit smoking if you smoke.  Lessen how much alcohol you drink.  Lessen how much caffeine you drink, or stop drinking caffeine.  Eat and sleep on a regular schedule.  Get 7 to 9 hours of sleep, or as told by your doctor.  Keep lights dim if bright lights bother you or make your headaches worse. GET HELP RIGHT AWAY IF:   Your headache becomes really bad.  You have a fever.  You have a stiff neck.  You have trouble seeing.  Your muscles are weak, or you lose muscle control.  You lose your balance or have trouble walking.  You feel like you will pass out (faint), or you pass out.  You have really bad symptoms that are different than your first symptoms.  You have problems with the medicines given to you by your doctor.  Your medicines do not work.  Your headache feels  different than the other headaches.  You feel sick to your stomach (nauseous) or throw up (vomit). MAKE SURE YOU:   Understand these instructions.  Will watch your condition.  Will get help right away if you are not doing well or get worse. Document Released: 07/21/2008 Document Revised: 01/04/2012 Document Reviewed: 10/02/2011 [facility] Patient Information 2015 [place], [place]. This information is not intended to replace advice given to you by your health care provider. Make sure you discuss any questions you have with  your health care provider.

## 2015-05-04 NOTE — ED Notes (Signed)
Pt. Stated, I have a lump or bump on my right eye above the eye lid. For 6 months.  Hurts, I've tried warm compresses.

## 2015-06-11 ENCOUNTER — Encounter: Payer: Self-pay | Admitting: Physician Assistant

## 2015-06-25 ENCOUNTER — Ambulatory Visit (INDEPENDENT_AMBULATORY_CARE_PROVIDER_SITE_OTHER): Payer: Medicaid Other | Admitting: Physician Assistant

## 2015-06-25 ENCOUNTER — Other Ambulatory Visit (INDEPENDENT_AMBULATORY_CARE_PROVIDER_SITE_OTHER): Payer: Medicaid Other

## 2015-06-25 ENCOUNTER — Encounter: Payer: Self-pay | Admitting: Physician Assistant

## 2015-06-25 VITALS — BP 104/70 | HR 73 | Ht 67.25 in | Wt 213.0 lb

## 2015-06-25 DIAGNOSIS — R112 Nausea with vomiting, unspecified: Secondary | ICD-10-CM | POA: Diagnosis not present

## 2015-06-25 DIAGNOSIS — R7989 Other specified abnormal findings of blood chemistry: Secondary | ICD-10-CM

## 2015-06-25 DIAGNOSIS — R1310 Dysphagia, unspecified: Secondary | ICD-10-CM

## 2015-06-25 DIAGNOSIS — R1013 Epigastric pain: Secondary | ICD-10-CM | POA: Diagnosis not present

## 2015-06-25 DIAGNOSIS — R945 Abnormal results of liver function studies: Secondary | ICD-10-CM

## 2015-06-25 DIAGNOSIS — Z1211 Encounter for screening for malignant neoplasm of colon: Secondary | ICD-10-CM

## 2015-06-25 DIAGNOSIS — Z800 Family history of malignant neoplasm of digestive organs: Secondary | ICD-10-CM

## 2015-06-25 DIAGNOSIS — B1920 Unspecified viral hepatitis C without hepatic coma: Secondary | ICD-10-CM

## 2015-06-25 LAB — COMPREHENSIVE METABOLIC PANEL
ALT: 303 U/L — ABNORMAL HIGH (ref 0–35)
AST: 169 U/L — AB (ref 0–37)
Albumin: 4.1 g/dL (ref 3.5–5.2)
Alkaline Phosphatase: 87 U/L (ref 39–117)
BUN: 15 mg/dL (ref 6–23)
CO2: 30 mEq/L (ref 19–32)
CREATININE: 0.68 mg/dL (ref 0.40–1.20)
Calcium: 9.5 mg/dL (ref 8.4–10.5)
Chloride: 103 mEq/L (ref 96–112)
GFR: 96.84 mL/min (ref >60.00)
GLUCOSE: 87 mg/dL (ref 70–99)
POTASSIUM: 3.8 meq/L (ref 3.5–5.1)
SODIUM: 138 meq/L (ref 135–145)
Total Bilirubin: 0.6 mg/dL (ref 0.2–1.2)
Total Protein: 7.8 g/dL (ref 6.0–8.3)

## 2015-06-25 LAB — CBC WITH DIFFERENTIAL/PLATELET
BASOS ABS: 0 10*3/uL (ref 0.0–0.1)
Basophils Relative: 0.3 % (ref 0.0–3.0)
EOS PCT: 2.3 % (ref 0.0–5.0)
Eosinophils Absolute: 0.2 10*3/uL (ref 0.0–0.7)
HCT: 43.5 % (ref 36.0–46.0)
HEMOGLOBIN: 14.6 g/dL (ref 12.0–15.0)
Lymphocytes Relative: 37.7 % (ref 12.0–46.0)
Lymphs Abs: 3.8 10*3/uL (ref 0.7–4.0)
MCHC: 33.5 g/dL (ref 30.0–36.0)
MCV: 92.4 fl (ref 78.0–100.0)
MONO ABS: 0.6 10*3/uL (ref 0.1–1.0)
Monocytes Relative: 5.6 % (ref 3.0–12.0)
NEUTROS PCT: 54.1 % (ref 43.0–77.0)
Neutro Abs: 5.5 10*3/uL (ref 1.4–7.7)
Platelets: 288 10*3/uL (ref 150.0–400.0)
RBC: 4.7 Mil/uL (ref 3.87–5.11)
RDW: 15.1 % (ref 11.5–15.5)
WBC: 10.2 10*3/uL (ref 4.0–10.5)

## 2015-06-25 LAB — FERRITIN: FERRITIN: 205.5 ng/mL (ref 10.0–291.0)

## 2015-06-25 LAB — IBC PANEL
Iron: 128 ug/dL (ref 42–145)
SATURATION RATIOS: 31.2 % (ref 20.0–50.0)
TRANSFERRIN: 293 mg/dL (ref 212.0–360.0)

## 2015-06-25 LAB — FOLATE: Folate: 16.7 ng/mL (ref >5.9)

## 2015-06-25 LAB — AMYLASE: Amylase: 40 U/L (ref 27–131)

## 2015-06-25 LAB — AMMONIA: Ammonia: 29 umol/L (ref 11–35)

## 2015-06-25 LAB — SEDIMENTATION RATE: SED RATE: 19 mm/h (ref 0–22)

## 2015-06-25 LAB — HIGH SENSITIVITY CRP: CRP HIGH SENSITIVITY: 3.59 mg/L (ref 0.000–5.000)

## 2015-06-25 LAB — LIPASE: Lipase: 10 U/L — ABNORMAL LOW (ref 11.0–59.0)

## 2015-06-25 LAB — PROTIME-INR
INR: 1.1 ratio — ABNORMAL HIGH (ref 0.8–1.0)
PROTHROMBIN TIME: 12.4 s (ref 9.6–13.1)

## 2015-06-25 LAB — VITAMIN B12: Vitamin B-12: 625 pg/mL (ref 211–911)

## 2015-06-25 LAB — TSH: TSH: 1.2 u[IU]/mL (ref 0.35–4.50)

## 2015-06-25 MED ORDER — ESOMEPRAZOLE MAGNESIUM 40 MG PO PACK: 40.0000 mg | PACK | Freq: Every day | ORAL | Status: AC
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
## 2015-06-25 NOTE — Patient Instructions (Signed)
You have been scheduled for an endoscopy and colonoscopy. Please follow the written instructions given to you at your visit today.  Please pick up your prep supplies at the pharmacy within the next 1-3 days.  If you use inhalers (even only as needed), please bring them with you on the day of your procedure.  Your physician has requested that you go to www.startemmi.com and enter the access code given to you at your visit today. This web site gives a general overview about your procedure. However, you should still follow specific instructions given to you by our office regarding your preparation for the procedure.  You have been scheduled for an abdominal ultrasound at [facility] (1st floor of hospital) on 07/05/15 at 9 am. Please arrive 15 minutes prior to your appointment for registration. Make certain not to have anything to eat or drink 6 hours prior to your appointment. Should you need to reschedule your appointment, please contact radiology at [phone]. This test typically takes about 30 minutes to perform.  You have been scheduled for a Barium Esophogram at [facility] (1st floor of the hospital) on 07/05/15 at 9 am. Please arrive 15 minutes prior to your appointment for registration. Make certain not to have anything to eat or drink 6 hours prior to your test. If you need to reschedule for any reason, please contact radiology at [phone] to do so. __________________________________________________________________ A barium swallow is an examination that concentrates on views of the esophagus. This tends to be a double contrast exam (barium and two liquids which, when combined, create a gas to distend the wall of the oesophagus) or single contrast (non-ionic iodine based). The study is usually tailored to your symptoms so a good history is essential. Attention is paid during the study to the form, structure and configuration of the esophagus, looking for functional disorders  (such as aspiration, dysphagia, achalasia, motility and reflux) EXAMINATION You may be asked to change into a gown, depending on the type of swallow being performed. A radiologist and radiographer will perform the procedure. The radiologist will advise you of the type of contrast selected for your procedure and direct you during the exam. You will be asked to stand, sit or lie in several different positions and to hold a small amount of fluid in your mouth before being asked to swallow while the imaging is performed .In some instances you may be asked to swallow barium coated marshmallows to assess the motility of a solid food bolus. The exam can be recorded as a digital or video fluoroscopy procedure. POST PROCEDURE It will take 1-2 days for the barium to pass through your system. To facilitate this, it is important, unless otherwise directed, to increase your fluids for the next 24-48hrs and to resume your normal diet.  This test typically takes about 30 minutes to perform. __________________________________________________________________________________  Your physician has requested that you go to the basement for  lab work before leaving today.    We have sent  medications to your pharmacy for you to pick up at your convenience.  Stop taking the pantoprazole, begin Nexium

## 2015-06-26 LAB — ANA: ANA: NEGATIVE

## 2015-06-26 LAB — TISSUE TRANSGLUTAMINASE, IGA: Tissue Transglutaminase Ab, IgA: 1 U/mL (ref <4)

## 2015-06-26 LAB — AFP TUMOR MARKER: AFP-Tumor Marker: 6.2 ng/mL — ABNORMAL HIGH (ref <6.1)

## 2015-06-26 LAB — MITOCHONDRIAL ANTIBODIES: Mitochondrial M2 Ab, IgG: 0.43 (ref <0.91)

## 2015-06-27 LAB — HEPATITIS C GENOTYPE

## 2015-06-27 LAB — CERULOPLASMIN: CERULOPLASMIN: 30 mg/dL (ref 18–53)

## 2015-06-27 LAB — ALPHA-1-ANTITRYPSIN: A-1 Antitrypsin, Ser: 213 mg/dL — ABNORMAL HIGH (ref 83–199)

## 2015-06-27 LAB — IGA: IgA: 293 mg/dL (ref 68–378)

## 2015-06-28 LAB — ANTI-SMOOTH MUSCLE ANTIBODY, IGG: SMOOTH MUSCLE AB: 7 U (ref <20)

## 2015-07-02 ENCOUNTER — Telehealth: Payer: Self-pay | Admitting: Physician Assistant

## 2015-07-02 NOTE — Telephone Encounter (Signed)
See results note. 

## 2015-07-02 NOTE — Telephone Encounter (Signed)
Left a message for patient to call back. 

## 2015-07-05 ENCOUNTER — Ambulatory Visit (HOSPITAL_COMMUNITY)
Admission: RE | Admit: 2015-07-05 | Discharge: 2015-07-05 | Disposition: A | Discharge status: Home / Self Care | Payer: Medicaid Other | Source: Ambulatory Visit | Attending: Physician Assistant | Admitting: Physician Assistant

## 2015-07-05 DIAGNOSIS — R112 Nausea with vomiting, unspecified: Secondary | ICD-10-CM | POA: Diagnosis not present

## 2015-07-05 DIAGNOSIS — Z1211 Encounter for screening for malignant neoplasm of colon: Secondary | ICD-10-CM

## 2015-07-05 DIAGNOSIS — Z8542 Personal history of malignant neoplasm of other parts of uterus: Secondary | ICD-10-CM | POA: Insufficient documentation

## 2015-07-05 DIAGNOSIS — K219 Gastro-esophageal reflux disease without esophagitis: Secondary | ICD-10-CM | POA: Insufficient documentation

## 2015-07-05 DIAGNOSIS — R1013 Epigastric pain: Secondary | ICD-10-CM

## 2015-07-05 DIAGNOSIS — R945 Abnormal results of liver function studies: Secondary | ICD-10-CM

## 2015-07-05 DIAGNOSIS — R1310 Dysphagia, unspecified: Secondary | ICD-10-CM | POA: Diagnosis not present

## 2015-07-05 DIAGNOSIS — R748 Abnormal levels of other serum enzymes: Secondary | ICD-10-CM | POA: Diagnosis not present

## 2015-07-05 DIAGNOSIS — Z853 Personal history of malignant neoplasm of breast: Secondary | ICD-10-CM | POA: Insufficient documentation

## 2015-07-05 DIAGNOSIS — B1920 Unspecified viral hepatitis C without hepatic coma: Secondary | ICD-10-CM | POA: Insufficient documentation

## 2015-07-05 DIAGNOSIS — R7989 Other specified abnormal findings of blood chemistry: Secondary | ICD-10-CM

## 2015-08-12 ENCOUNTER — Telehealth: Payer: Self-pay | Admitting: Internal Medicine

## 2015-08-12 MED ORDER — POLYETHYLENE GLYCOL 3350 17 GM/SCOOP PO POWD: 1.0000 | ORAL | Status: DC

## 2015-08-12 MED ORDER — BISACODYL 5 MG PO TBEC: DELAYED_RELEASE_TABLET | ORAL | Status: DC

## 2015-08-12 NOTE — Telephone Encounter (Signed)
Rx sent for miralax and dulcolax as pt thinks insurance will pay for script better than getting otc.

## 2015-08-14 ENCOUNTER — Ambulatory Visit (AMBULATORY_SURGERY_CENTER): Payer: Medicaid Other | Admitting: Internal Medicine

## 2015-08-14 ENCOUNTER — Encounter: Payer: Self-pay | Admitting: Internal Medicine

## 2015-08-14 VITALS — BP 107/55 | HR 59 | Temp 97.9°F | Resp 35 | Ht 67.25 in | Wt 213.0 lb

## 2015-08-14 DIAGNOSIS — D125 Benign neoplasm of sigmoid colon: Secondary | ICD-10-CM

## 2015-08-14 DIAGNOSIS — R1310 Dysphagia, unspecified: Secondary | ICD-10-CM

## 2015-08-14 DIAGNOSIS — Z800 Family history of malignant neoplasm of digestive organs: Secondary | ICD-10-CM | POA: Diagnosis not present

## 2015-08-14 DIAGNOSIS — K2100 Gastro-esophageal reflux disease with esophagitis, without bleeding: Secondary | ICD-10-CM

## 2015-08-14 DIAGNOSIS — K2990 Gastroduodenitis, unspecified, without bleeding: Secondary | ICD-10-CM

## 2015-08-14 DIAGNOSIS — D120 Benign neoplasm of cecum: Secondary | ICD-10-CM

## 2015-08-14 DIAGNOSIS — R1013 Epigastric pain: Secondary | ICD-10-CM

## 2015-08-14 DIAGNOSIS — Z1211 Encounter for screening for malignant neoplasm of colon: Secondary | ICD-10-CM

## 2015-08-14 DIAGNOSIS — K2970 Gastritis, unspecified, without bleeding: Secondary | ICD-10-CM

## 2015-08-14 MED ORDER — FLUCONAZOLE 100 MG PO TABS: ORAL_TABLET | ORAL | Status: DC

## 2015-08-14 MED ORDER — SODIUM CHLORIDE 0.9 % IV SOLN: 500.0000 mL | INTRAVENOUS | Status: DC

## 2015-08-14 NOTE — Progress Notes (Signed)
Called to room to assist during endoscopic procedure.  Patient ID and intended procedure confirmed with present staff. Received instructions for my participation in the procedure from the performing physician.  

## 2015-08-14 NOTE — Patient Instructions (Signed)
Discharge instructions given. Handouts on polyps and diverticulosis. Prescription  sent into pharmacy. Resume previous medications. Hold aspirin and all NSAIDS for 2 weeks. YOU HAD AN ENDOSCOPIC PROCEDURE TODAY AT [facility]:   Refer to the procedure report that was given to you for any specific questions about what was found during the examination.  If the procedure report does not answer your questions, please call your gastroenterologist to clarify.  If you requested that your care partner not be given the details of your procedure findings, then the procedure report has been included in a sealed envelope for you to review at your convenience later.  YOU SHOULD EXPECT: Some feelings of bloating in the abdomen. Passage of more gas than usual.  Walking can help get rid of the air that was put into your GI tract during the procedure and reduce the bloating. If you had a lower endoscopy (such as a colonoscopy or flexible sigmoidoscopy) you may notice spotting of blood in your stool or on the toilet paper. If you underwent a bowel prep for your procedure, you may not have a normal bowel movement for a few days.  Please Note:  You might notice some irritation and congestion in your nose or some drainage.  This is from the oxygen used during your procedure.  There is no need for concern and it should clear up in a day or so.  SYMPTOMS TO REPORT IMMEDIATELY:   Following lower endoscopy (colonoscopy or flexible sigmoidoscopy):  Excessive amounts of blood in the stool  Significant tenderness or worsening of abdominal pains  Swelling of the abdomen that is new, acute  Fever of 100F or higher   Following upper endoscopy (EGD)  Vomiting of blood or coffee ground material  New chest pain or pain under the shoulder blades  Painful or persistently difficult swallowing  New shortness of breath  Fever of 100F or higher  Black, tarry-looking stools  For urgent or emergent issues, a  gastroenterologist can be reached at any hour by calling [phone].   DIET: Your first meal following the procedure should be a small meal and then it is ok to progress to your normal diet. Heavy or fried foods are harder to digest and may make you feel nauseous or bloated.  Likewise, meals heavy in dairy and vegetables can increase bloating.  Drink plenty of fluids but you should avoid alcoholic beverages for 24 hours.  ACTIVITY:  You should plan to take it easy for the rest of today and you should NOT DRIVE or use heavy machinery until tomorrow (because of the sedation medicines used during the test).    FOLLOW UP: Our staff will call the number listed on your records the next business day following your procedure to check on you and address any questions or concerns that you may have regarding the information given to you following your procedure. If we do not reach you, we will leave a message.  However, if you are feeling well and you are not experiencing any problems, there is no need to return our call.  We will assume that you have returned to your regular daily activities without incident.  If any biopsies were taken you will be contacted by phone or by letter within the next 1-3 weeks.  Please call us at [phone] if you have not heard about the biopsies in 3 weeks.    SIGNATURES/CONFIDENTIALITY: You and/or your care partner have signed paperwork which will be entered into  your electronic medical record.  These signatures attest to the fact that that the information above on your After Visit Summary has been reviewed and is understood.  Full responsibility of the confidentiality of this discharge information lies with you and/or your care-partner.

## 2015-08-14 NOTE — Progress Notes (Signed)
A/ox3 pleased with MAC, report to Celia RN 

## 2015-08-14 NOTE — Op Note (Signed)
[place]  [address] [place], [address]   COLONOSCOPY PROCEDURE REPORT  PATIENT: Eileen, Douglas  MR#: [id number] BIRTHDATE: September 15, 1966 , 49  yrs. old GENDER: female ENDOSCOPIST: Jerene Bears, MD PROCEDURE DATE:  08/14/2015 PROCEDURE:   Colonoscopy, screening and Colonoscopy with snare polypectomy First Screening Colonoscopy - Avg.  risk and is 50 yrs.  old or older - No.  Prior Negative Screening - Now for repeat screening. N/A  History of Adenoma - Now for follow-up colonoscopy & has been > or = to 3 yrs.  N/A  Polyps removed today? Yes ASA CLASS:   Class III INDICATIONS:Screening for colonic neoplasia and FH Colon cancer in brother at age 66, 1st colonoscopy MEDICATIONS: Monitored anesthesia care, Propofol 400 mg IV, and this was the total dose used for all procedures at this session  DESCRIPTION OF PROCEDURE:   After the risks benefits and alternatives of the procedure were thoroughly explained, informed consent was obtained.  The digital rectal exam revealed no rectal mass.   The LB [id number]  endoscope was introduced through the anus and advanced to the cecum, which was identified by both the appendix and ileocecal valve. No adverse events experienced. The quality of the prep was (MiraLax was used) fair.  The instrument was then slowly withdrawn as the colon was fully examined. Estimated blood loss is zero unless otherwise noted in this procedure report.  COLON FINDINGS: A sessile polyp measuring 2 mm in size was found at the cecum.  A polypectomy was performed with cold forceps.  The resection was complete, the polyp tissue was completely retrieved and sent to histology.  Small lipoma at hepatic flexure.  A semi-pedunculated polyp measuring 10 mm in size was found in the sigmoid colon.  A polypectomy was performed using snare cautery. The resection was complete, the polyp tissue was completely retrieved and sent to histology.   Two sessile  polyps ranging between 3-51mm in size were found in the rectosigmoid colon. Polypectomies were performed with a cold snare.  The resection was complete, the polyp tissue was completely retrieved and sent to histology.   There was moderate diverticulosis noted in the left colon.  Retroflexed views revealed internal hemorrhoids. The time to cecum = 4.6 Withdrawal time = 18.6   The scope was withdrawn and the procedure completed.  COMPLICATIONS: There were no immediate complications.      ENDOSCOPIC IMPRESSION: 1.   Sessile polyp was found at the cecum; polypectomy was performed with cold forceps 2.   Semi-pedunculated polyp was found in the sigmoid colon; polypectomy was performed using snare cautery 3.   Two sessile polyps ranging between 3-58mm in size were found in the rectosigmoid colon; polypectomies were performed with a cold snare 4.   Moderate diverticulosis was noted in the left colon  RECOMMENDATIONS: 1.  Hold Aspirin and all other NSAIDs for 2 weeks. 2.  Await pathology results 3.  High fiber diet 4.  Repeat Colonoscopy in 3 years. 5.  You will receive a letter within 1-2 weeks with the results of your biopsy as well as final recommendations.  Please call my office if you have not received a letter after 3 weeks.  eSigned:  Jerene Bears, MD 08/14/2015 4:18 PM   cc:  the patient, PCP   PATIENT NAME:  Eileen, Douglas MR#: [id number]

## 2015-08-14 NOTE — Op Note (Signed)
[place]  [address] [place], [address]   ENDOSCOPY PROCEDURE REPORT  PATIENT: Eileen, Douglas  MR#: [id number] BIRTHDATE: February 09, 1966 , 49  yrs. old GENDER: female ENDOSCOPIST: Jerene Bears, MD PROCEDURE DATE:  08/14/2015 PROCEDURE:  EGD, diagnostic and EGD w/ biopsy ASA CLASS:     Class III INDICATIONS:  dysphagia and history of GERD. MEDICATIONS: Monitored anesthesia care, Propofol 400 mg IV, and this was the total dose used for all procedures at this session TOPICAL ANESTHETIC: none  DESCRIPTION OF PROCEDURE: After the risks benefits and alternatives of the procedure were thoroughly explained, informed consent was obtained.  The LB [id number] endoscope was introduced through the mouth and advanced to the second portion of the duodenum , Without limitations.  The instrument was slowly withdrawn as the mucosa was fully examined.  ESOPHAGUS: White exudates consistent with candidiasis were found in the upper third of the esophagus and middle third esophagus. There was LA Class B esophagitis at GEJ.   No evidence of Barrett's esophagus.  STOMACH: There was mild antral gastropathy noted.  Cold forcep biopsies were taken at the body, antrum and angularis.  DUODENUM: The duodenal mucosa showed no abnormalities in the bulb and 2nd part of the duodenum.  Retroflexed views revealed no abnormalities.     The scope was then withdrawn from the patient and the procedure completed.  COMPLICATIONS: There were no immediate complications.  ENDOSCOPIC IMPRESSION: 1.   White exudates consistent with candidiasis in the upper third of the esophagus and middle third esophagus 2.   There was reflux esophagitis noted 3.   There was mild antral gastropathy; multiple biopsies 4.   The duodenal mucosa showed no abnormalities in the bulb and 2nd part of the duodenum  RECOMMENDATIONS: 1.  Await biopsy results 2.  Anti-reflux regimen to be follow 3.  Continue Nexium  40 mg daily 4.  Treatment for Candida esophagitis with fluconazole 200 mg -1 day then 100 mg -13 days  eSigned:  Jerene Bears, MD 08/14/2015 4:13 PM  CC: the patient, PCP

## 2015-08-15 ENCOUNTER — Telehealth: Payer: Self-pay | Admitting: *Deleted

## 2015-08-15 NOTE — Telephone Encounter (Signed)
Left message that we called for f/u 

## 2015-08-20 ENCOUNTER — Encounter: Payer: Self-pay | Admitting: Internal Medicine

## 2016-05-19 ENCOUNTER — Other Ambulatory Visit: Payer: Self-pay | Admitting: Student

## 2016-05-19 DIAGNOSIS — B182 Chronic viral hepatitis C: Secondary | ICD-10-CM

## 2016-05-25 ENCOUNTER — Ambulatory Visit: Payer: Medicaid Other

## 2016-06-01 ENCOUNTER — Other Ambulatory Visit: Payer: Self-pay | Admitting: Student

## 2016-06-01 ENCOUNTER — Ambulatory Visit
Admission: RE | Admit: 2016-06-01 | Discharge: 2016-06-01 | Disposition: A | Discharge status: Home / Self Care | Payer: Medicaid Other | Source: Ambulatory Visit | Attending: Student | Admitting: Student

## 2016-06-01 DIAGNOSIS — B182 Chronic viral hepatitis C: Secondary | ICD-10-CM | POA: Diagnosis present

## 2016-06-03 ENCOUNTER — Ambulatory Visit
Admission: RE | Admit: 2016-06-03 | Discharge: 2016-06-03 | Disposition: A | Discharge status: Home / Self Care | Payer: Medicaid Other | Source: Ambulatory Visit | Attending: Student | Admitting: Student

## 2016-06-03 DIAGNOSIS — B182 Chronic viral hepatitis C: Secondary | ICD-10-CM | POA: Insufficient documentation

## 2016-08-09 ENCOUNTER — Emergency Department
Admission: EM | Admit: 2016-08-09 | Discharge: 2016-08-09 | Disposition: A | Discharge status: Home / Self Care | Payer: Medicaid Other | Attending: Emergency Medicine | Admitting: Emergency Medicine

## 2016-08-09 ENCOUNTER — Encounter (HOSPITAL_COMMUNITY): Payer: Self-pay

## 2016-08-09 ENCOUNTER — Emergency Department (HOSPITAL_COMMUNITY)
Admission: EM | Admit: 2016-08-09 | Discharge: 2016-08-09 | Disposition: A | Discharge status: Home / Self Care | Payer: Medicare Other | Attending: Dermatology | Admitting: Dermatology

## 2016-08-09 DIAGNOSIS — Z853 Personal history of malignant neoplasm of breast: Secondary | ICD-10-CM | POA: Insufficient documentation

## 2016-08-09 DIAGNOSIS — Z8673 Personal history of transient ischemic attack (TIA), and cerebral infarction without residual deficits: Secondary | ICD-10-CM | POA: Insufficient documentation

## 2016-08-09 DIAGNOSIS — H0015 Chalazion left lower eyelid: Secondary | ICD-10-CM | POA: Diagnosis not present

## 2016-08-09 DIAGNOSIS — I10 Essential (primary) hypertension: Secondary | ICD-10-CM | POA: Diagnosis not present

## 2016-08-09 DIAGNOSIS — Z8541 Personal history of malignant neoplasm of cervix uteri: Secondary | ICD-10-CM | POA: Diagnosis not present

## 2016-08-09 DIAGNOSIS — H5712 Ocular pain, left eye: Secondary | ICD-10-CM | POA: Diagnosis present

## 2016-08-09 DIAGNOSIS — Z79899 Other long term (current) drug therapy: Secondary | ICD-10-CM | POA: Diagnosis not present

## 2016-08-09 DIAGNOSIS — Z5321 Procedure and treatment not carried out due to patient leaving prior to being seen by health care provider: Secondary | ICD-10-CM | POA: Diagnosis not present

## 2016-08-09 DIAGNOSIS — F17200 Nicotine dependence, unspecified, uncomplicated: Secondary | ICD-10-CM | POA: Insufficient documentation

## 2016-08-09 MED ORDER — KETOROLAC TROMETHAMINE 30 MG/ML IJ SOLN
30.0000 mg | Freq: Once | INTRAMUSCULAR | Status: AC
  Administered 2016-08-09: 30 mg via INTRAMUSCULAR
  Filled 2016-08-09: qty 1

## 2016-08-09 MED ORDER — ERYTHROMYCIN 5 MG/GM OP OINT
1.0000 | TOPICAL_OINTMENT | Freq: Four times a day (QID) | OPHTHALMIC | 0 refills | Status: AC
Start: 2016-08-09 — End: ?

## 2016-08-09 MED ORDER — ERYTHROMYCIN 5 MG/GM OP OINT
TOPICAL_OINTMENT | Freq: Once | OPHTHALMIC | Status: AC
  Administered 2016-08-09: 17:00:00 via OPHTHALMIC
  Filled 2016-08-09: qty 1

## 2016-08-09 NOTE — ED Provider Notes (Signed)
[facility] Emergency Department Provider Note  ____________________________________________  Time seen: Approximately 4:58 PM  I have reviewed the triage vital signs and the nursing notes.   HISTORY  Chief Complaint Eye Pain    HPI Eileen Douglas is a 50 y.o. female , NAD, presents to emergency department with 2 day history of left lower eyelid swelling and drainage. 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 Has seen ophthalmology in regards to cysts and was told he would have to be surgically removed. Notes that she has a swelling about the left lower eyelid that is been draining over the last 2 days has been causing pain about the entire eyelid. States her vision in the left eye has been blurry but has not lost vision. Denies any pain in the eye globe. Has not had any injury or trauma to the eyes or face. Has not had any upper respiratory symptoms. Denies any fevers, chills, body aches. Has been cleansing the eye with Vineyards baby shampoo which has not alleviated her symptoms.   Past Medical History:  Diagnosis Date  . Acid reflux   . Anxiety   . Arthritis   . Breast cancer ([place])   . Cervical cancer ([place])   . Chronic pain   . Colitis   . Degenerative disc disease, cervical   . Degenerative disc disease, cervical   . Degenerative disc disease, lumbar   . Fibromyalgia   . GERD (gastroesophageal reflux disease)   . H/O bladder problems   . Hepatitis C   . Hypertension   . Laryngopharyngeal reflux (LPR)   . Neuropathy ([place])    in all extremities  . Other voice and resonance disorders   . Polyp of vocal cord and larynx   . Stroke ([place])   . Uterine cancer [facility])     Patient Active Problem List   Diagnosis Date Noted  . DEPRESSION 06/14/2007  . GERD 06/14/2007  . [place] DISEASE 06/14/2007  . NECK PAIN, CHRONIC 06/14/2007  . BACK PAIN, CHRONIC, HX OF 06/14/2007    Past Surgical  History:  Procedure Laterality Date  . BACK SURGERY    . BREAST BIOPSY    . CARPAL TUNNEL RELEASE    . direct laryngoscopy with stripping of vocal cords    . NECK SURGERY    . RADICAL ABDOMINAL HYSTERECTOMY    . SHOULDER SURGERY    . TONSILLECTOMY    . TONSILLECTOMY AND ADENOIDECTOMY    . tumor removed from throat      Prior to Admission medications   Medication Sig Start Date End Date Taking? Authorizing Provider  albuterol (PROVENTIL HFA;VENTOLIN HFA) 108 (90 BASE) MCG/ACT inhaler Inhale 2 puffs into the lungs every 6 (six) hours as needed for wheezing or shortness of breath.    Historical Provider, MD  ALPRAZolam Duanne Moron) 1 MG tablet Take 1 mg by mouth 2 (two) times daily as needed for anxiety.    Historical Provider, MD  erythromycin ophthalmic ointment Place 1 application into the left eye 4 (four) times daily. Apply 1cm ribbon to lower eyelid 4 times daily. 08/09/16   Roylene Heaton L Kinser Fellman, PA-C  esomeprazole (NEXIUM) 40 MG packet Take 40 mg by mouth daily before breakfast. 06/25/15   Lori P Hvozdovic, PA-C  lisinopril (PRINIVIL,ZESTRIL) 20 MG tablet Take 20 mg by mouth daily.    Historical Provider, MD  methocarbamol (ROBAXIN) 500 MG tablet Take 500 mg  by mouth 2 (two) times daily.    Historical Provider, MD  Oxycodone HCl 10 MG TABS Take 10 mg by mouth. 1 tablet 5 times a day    Historical Provider, MD  tiZANidine (ZANAFLEX) 4 MG tablet Take 4 mg by mouth every 6 (six) hours as needed for muscle spasms.    Historical Provider, MD    Allergies Hydrocodone  Family History  Problem Relation Age of Onset  . Allergic rhinitis Daughter   . Heart disease Father   . Other Father     Malignant neoplasm  . Stomach cancer Father   . Other Brother     Malignant neoplasm  . Colon cancer Brother   . Mental retardation    . Migraines    . Transient ischemic attack Mother     Social History Social History  Substance Use Topics  . Smoking status: Current Every Day Smoker    Packs/day:  0.25  . Smokeless tobacco: Never Used  . Alcohol use No     Review of Systems  Constitutional: No fever/chills Eyes: Blurred vision left eye. Left lower eyelid swelling and drainage with pain. No right eye pain, redness, swelling, drainage.  ENT: No sinus pressure, nasal congestion. Musculoskeletal: Negative for general myalgias.  Skin: Negative for rash, redness, abnormal warmth. Neurological: Negative for headaches.   ____________________________________________   PHYSICAL EXAM:  VITAL SIGNS: ED Triage Vitals  Enc Vitals Group     BP 08/09/16 1641 (!) 112/99     Pulse Rate 08/09/16 1641 80     Resp 08/09/16 1641 16     Temp 08/09/16 1641 98 F (36.7 C)     Temp Source 08/09/16 1641 Oral     SpO2 08/09/16 1641 96 %     Weight 08/09/16 1642 180 lb (81.6 kg)     Height 08/09/16 1642 5\' 6"  (1.676 m)     Head Circumference --      Peak Flow --      Pain Score 08/09/16 1642 9     Pain Loc --      Pain Edu? --      Excl. in [place]? --      Constitutional: Alert and oriented. Well appearing and in no acute distress. Eyes: Left lower eyelid with 0.5cm chalazion with yellow drainage with significant tenderness to palpation. Left lower eyelid without edema. Conjunctivae are normal bilaterally. PERRLA. EOMI without pain. Vision is 20/40 in right eye and 20/70 in left. Head: Atraumatic. Neck: Supple with FROM. Hematological/Lymphatic/Immunilogical: No cervical lymphadenopathy. Cardiovascular: Good peripheral circulation. Respiratory: Normal respiratory effort without tachypnea or retractions.  Neurologic:  Normal speech and language. No gross focal neurologic deficits are appreciated.  Skin:  Skin is warm, dry and intact. No rash noted. Psychiatric: Mood and affect are normal. Speech and behavior are normal. Patient exhibits appropriate insight and judgement.   ____________________________________________    LABS None ____________________________________________  EKG  None ____________________________________________  RADIOLOGY  None ____________________________________________    PROCEDURES  Procedure(s) performed: None   Procedures   Medications  erythromycin ophthalmic ointment ( Left Eye Given 08/09/16 1721)  ketorolac (TORADOL) 30 MG/ML injection 30 mg (30 mg Intramuscular Given 08/09/16 1721)     ____________________________________________   INITIAL IMPRESSION / ASSESSMENT AND PLAN / ED COURSE  Pertinent labs & imaging results that were available during my care of the patient were reviewed by me and considered in my medical decision making (see chart for details).  Clinical Course    Patient's  diagnosis is consistent with Chalazion of the left lower eyelid. Patient will be discharged home with prescriptions for erythromycin ophthalmic ointment to use as directed. Patient is to follow up with Dr. Wallace Going in ophthalmology tomorrow for further evaluation and treatment. Patient is given ED precautions to return to the ED for any worsening or new symptoms.    ____________________________________________  FINAL CLINICAL IMPRESSION(S) / ED DIAGNOSES  Final diagnoses:  Chalazion of left lower eyelid      NEW MEDICATIONS STARTED DURING THIS VISIT:  New Prescriptions   ERYTHROMYCIN OPHTHALMIC OINTMENT    Place 1 application into the left eye 4 (four) times daily. Apply 1cm ribbon to lower eyelid 4 times daily.         Braxton Feathers, PA-C 08/09/16 1749    Harvest Dark, MD 08/09/16 2130

## 2016-08-09 NOTE — ED Triage Notes (Signed)
Pt c/o left eye pain with swelling and redness for the past 2 days.Eileen Douglas

## 2016-08-09 NOTE — Discharge Instructions (Signed)
Please see opthalmology tomorrow for follow up

## 2016-08-09 NOTE — ED Triage Notes (Signed)
Pt has a bump in her left eye. She appears to have a stye below the eye and reports pain as well as blurred vision. She reports the pain radiates into the face. Pt also reports some drainage from the stye.

## 2017-06-01 ENCOUNTER — Emergency Department (HOSPITAL_COMMUNITY): Payer: Medicaid Other

## 2017-06-01 ENCOUNTER — Emergency Department (HOSPITAL_COMMUNITY)
Admission: EM | Admit: 2017-06-01 | Discharge: 2017-06-01 | Disposition: A | Discharge status: Home / Self Care | Payer: Medicaid Other | Attending: Emergency Medicine | Admitting: Emergency Medicine

## 2017-06-01 ENCOUNTER — Encounter (HOSPITAL_COMMUNITY): Payer: Self-pay

## 2017-06-01 DIAGNOSIS — B1710 Acute hepatitis C without hepatic coma: Secondary | ICD-10-CM | POA: Diagnosis not present

## 2017-06-01 DIAGNOSIS — I10 Essential (primary) hypertension: Secondary | ICD-10-CM | POA: Insufficient documentation

## 2017-06-01 DIAGNOSIS — R531 Weakness: Secondary | ICD-10-CM

## 2017-06-01 DIAGNOSIS — Z8673 Personal history of transient ischemic attack (TIA), and cerebral infarction without residual deficits: Secondary | ICD-10-CM | POA: Insufficient documentation

## 2017-06-01 DIAGNOSIS — F17200 Nicotine dependence, unspecified, uncomplicated: Secondary | ICD-10-CM | POA: Diagnosis not present

## 2017-06-01 DIAGNOSIS — H532 Diplopia: Secondary | ICD-10-CM | POA: Insufficient documentation

## 2017-06-01 LAB — CBC WITH DIFFERENTIAL/PLATELET
Basophils Absolute: 0 10*3/uL (ref 0.0–0.1)
Basophils Relative: 0 %
Eosinophils Absolute: 0.1 10*3/uL (ref 0.0–0.7)
Eosinophils Relative: 2 %
HEMATOCRIT: 38.4 % (ref 36.0–46.0)
HEMOGLOBIN: 12.8 g/dL (ref 12.0–15.0)
LYMPHS ABS: 3.2 10*3/uL (ref 0.7–4.0)
LYMPHS PCT: 53 %
MCH: 29.9 pg (ref 26.0–34.0)
MCHC: 33.3 g/dL (ref 30.0–36.0)
MCV: 89.7 fL (ref 78.0–100.0)
Monocytes Absolute: 0.3 10*3/uL (ref 0.1–1.0)
Monocytes Relative: 6 %
NEUTROS ABS: 2.3 10*3/uL (ref 1.7–7.7)
NEUTROS PCT: 39 %
PLATELETS: 144 10*3/uL — AB (ref 150–400)
RBC: 4.28 MIL/uL (ref 3.87–5.11)
RDW: 15.3 % (ref 11.5–15.5)
WBC: 6 10*3/uL (ref 4.0–10.5)

## 2017-06-01 LAB — BASIC METABOLIC PANEL
ANION GAP: 7 (ref 5–15)
BUN: 12 mg/dL (ref 6–20)
CHLORIDE: 103 mmol/L (ref 101–111)
CO2: 25 mmol/L (ref 22–32)
Calcium: 8.8 mg/dL — ABNORMAL LOW (ref 8.9–10.3)
Creatinine, Ser: 0.64 mg/dL (ref 0.44–1.00)
GFR calc Af Amer: >60 mL/min (ref >60)
GFR calc non Af Amer: >60 mL/min (ref >60)
Glucose, Bld: 93 mg/dL (ref 65–99)
POTASSIUM: 4.1 mmol/L (ref 3.5–5.1)
Sodium: 135 mmol/L (ref 135–145)

## 2017-06-01 LAB — PROTIME-INR
INR: 0.95
Prothrombin Time: 12.7 seconds (ref 11.4–15.2)

## 2017-06-01 LAB — APTT: aPTT: 29 seconds (ref 24–36)

## 2017-06-01 MED ORDER — ONDANSETRON HCL 4 MG/2ML IJ SOLN
4.0000 mg | Freq: Once | INTRAMUSCULAR | Status: AC
  Administered 2017-06-01: 4 mg via INTRAVENOUS
  Filled 2017-06-01: qty 2

## 2017-06-01 MED ORDER — GADOBENATE DIMEGLUMINE 529 MG/ML IV SOLN
18.0000 mL | Freq: Once | INTRAVENOUS | Status: AC | PRN
  Administered 2017-06-01: 18 mL via INTRAVENOUS

## 2017-06-01 MED ORDER — LORAZEPAM 2 MG/ML IJ SOLN
1.0000 mg | Freq: Once | INTRAMUSCULAR | Status: AC
  Administered 2017-06-01: 1 mg via INTRAVENOUS
  Filled 2017-06-01: qty 1

## 2017-06-01 MED ORDER — HYDROMORPHONE HCL 1 MG/ML IJ SOLN
1.0000 mg | Freq: Once | INTRAMUSCULAR | Status: AC
  Administered 2017-06-01: 1 mg via INTRAVENOUS
  Filled 2017-06-01: qty 1

## 2017-06-01 NOTE — ED Provider Notes (Signed)
[facility] DEPT Provider Note   CSN: [id number] Arrival date & time: 06/01/17  1004     History   Chief Complaint No chief complaint on file.   HPI ROMEKA SCIFRES is a 51 y.o. female.  The history is provided by the patient and medical records. No language interpreter was used.   Ronniesha SUNYA HUMBARGER is a 51 y.o. female  with a PMH of prior stroke, HTN, Hep C, fibromyalgia, chronic pain who presents to the Emergency Department complaining of left-sided double vision associated with left-sided weakness x 1 week. Patient also endorses inability to completely open her left eye. She notes these symptoms started on Tuesday (7 days ago). She thought it may self-resolve, but did not. She then saw ophthalmology, Dr. Katy Fitch on Thursday (5 days ago), who instructed patient to go to ER for concerns of TIA vs. Aneurysm. Unfortunately, patient decided to see if symptoms would self resolve. No worsening of symptoms, but no improvement either. No headache or dizziness. Able to ambulate but not as steady as she usually does. Intermittent numb, tingling sensation to left upper and lower extremity. + back pain but hx of chronic back pain - no recent changes. No fever/chills, bowel/bladder incontinence or saddle anesthesia. Patient denies slurred speech but friend at bedside states that she will start mumbling which is not typical for her. This will come-and-go.  Past Medical History:  Diagnosis Date  . Acid reflux   . Anxiety   . Arthritis   . Breast cancer ([place])   . Cervical cancer ([place])   . Chronic pain   . Colitis   . Degenerative disc disease, cervical   . Degenerative disc disease, cervical   . Degenerative disc disease, lumbar   . Fibromyalgia   . GERD (gastroesophageal reflux disease)   . H/O bladder problems   . Hepatitis C   . Hypertension   . Laryngopharyngeal reflux (LPR)   . Neuropathy    in all extremities  . Other voice and resonance disorders   . Polyp of vocal cord and larynx   .  Stroke ([place])   . Uterine cancer [facility])     Patient Active Problem List   Diagnosis Date Noted  . DEPRESSION 06/14/2007  . GERD 06/14/2007  . [place] DISEASE 06/14/2007  . NECK PAIN, CHRONIC 06/14/2007  . BACK PAIN, CHRONIC, HX OF 06/14/2007    Past Surgical History:  Procedure Laterality Date  . BACK SURGERY    . BREAST BIOPSY    . CARPAL TUNNEL RELEASE    . direct laryngoscopy with stripping of vocal cords    . NECK SURGERY    . RADICAL ABDOMINAL HYSTERECTOMY    . SHOULDER SURGERY    . TONSILLECTOMY    . TONSILLECTOMY AND ADENOIDECTOMY    . tumor removed from throat      OB History    Gravida Para Term Preterm AB Living   2 1 1   1 1    SAB TAB Ectopic Multiple Live Births   1               Home Medications    Prior to Admission medications   Medication Sig Start Date End Date Taking? Authorizing Provider  albuterol (PROVENTIL HFA;VENTOLIN HFA) 108 (90 BASE) MCG/ACT inhaler Inhale 2 puffs into the lungs every 6 (six) hours as needed for wheezing or shortness of breath.    [provider]  ALPRAZolam Duanne Moron) 1 MG tablet Take 1 mg by mouth 2 (  two) times daily as needed for anxiety.    [provider]  erythromycin ophthalmic ointment Place 1 application into the left eye 4 (four) times daily. Apply 1cm ribbon to lower eyelid 4 times daily. 08/09/16   Hagler, Jami L, PA-C  esomeprazole (NEXIUM) 40 MG packet Take 40 mg by mouth daily before breakfast. 06/25/15   Hvozdovic, Lori P, PA-C  lisinopril (PRINIVIL,ZESTRIL) 20 MG tablet Take 20 mg by mouth daily.    [provider]  methocarbamol (ROBAXIN) 500 MG tablet Take 500 mg by mouth 2 (two) times daily.    [provider]  Oxycodone HCl 10 MG TABS Take 10 mg by mouth. 1 tablet 5 times a day    [provider]  tiZANidine (ZANAFLEX) 4 MG tablet Take 4 mg by mouth every 6 (six) hours as needed for muscle spasms.    [provider]    Family History Family  History  Problem Relation Age of Onset  . Allergic rhinitis Daughter   . Heart disease Father   . Other Father        Malignant neoplasm  . Stomach cancer Father   . Other Brother        Malignant neoplasm  . Colon cancer Brother   . Mental retardation Unknown   . Migraines Unknown   . Transient ischemic attack Mother     Social History Social History  Substance Use Topics  . Smoking status: Current Every Day Smoker    Packs/day: 0.25  . Smokeless tobacco: Never Used  . Alcohol use No     Allergies   Hydrocodone   Review of Systems Review of Systems  Eyes: Positive for photophobia and visual disturbance.  Neurological: Positive for weakness and numbness. Negative for dizziness, syncope and headaches.  All other systems reviewed and are negative.    Physical Exam Updated Vital Signs BP 139/84   Pulse 82   Temp 98.2 F (36.8 C) (Oral)   Resp 18   Ht 5\' 10"  (1.778 m)   Wt 85.7 kg (189 lb)   SpO2 98%   BMI 27.12 kg/m   Physical Exam  Constitutional: She is oriented to person, place, and time. She appears well-developed and well-nourished. No distress.  HENT:  Head: Normocephalic and atraumatic.  Cardiovascular: Normal rate, regular rhythm and normal heart sounds.   No murmur heard. Pulmonary/Chest: Effort normal and breath sounds normal. No respiratory distress.  Abdominal: Soft. She exhibits no distension. There is no tenderness.  Musculoskeletal: She exhibits no edema.  Neurological: She is alert and oriented to person, place, and time.  Alert, oriented, thought content appropriate, able to give a coherent history. Speech is clear and goal oriented, able to follow commands.  Cranial Nerves:  II:  Pupils equal, round, reactive to light III, IV, VI: EOM intact bilaterally V,VII: Eyes kept closed tightly against resistance, facial light touch sensation diminished on left as compared to right VIII: hearing grossly normal IX, X: symmetric soft palate  movement, uvula elevates symmetrically  XI: bilateral shoulder shrug symmetric and strong XII: midline tongue extension 5/5 muscle strength in right upper and lower extremities 4/5 muscle strength in left upper and lower extremities. Decreased sensation to left upper and lower extremity when compared to right. Normal finger-to-nose and rapid alternating movements; no drift.  Skin: Skin is warm and dry.  Nursing note and vitals reviewed.    ED Treatments / Results  Labs (all labs ordered are listed, but only abnormal results are  displayed) Labs Reviewed  BASIC METABOLIC PANEL - Abnormal; Notable for the following:       Result Value   Calcium 8.8 (*)    All other components within normal limits  CBC WITH DIFFERENTIAL/PLATELET - Abnormal; Notable for the following:    Platelets 144 (*)    All other components within normal limits  APTT  PROTIME-INR    EKG  EKG Interpretation None       Radiology Mr Jodene Nam Head Wo Contrast  Result Date: 06/01/2017 CLINICAL DATA:  Left-sided weakness, blurred vision EXAM: MRI HEAD WITHOUT CONTRAST MRA HEAD WITHOUT CONTRAST TECHNIQUE: Multiplanar, multiecho pulse sequences of the brain and surrounding structures were obtained without intravenous contrast. Angiographic images of the head were obtained using MRA technique without contrast. COMPARISON:  CT head 05/16/2009 FINDINGS: MRI HEAD FINDINGS Brain: Negative for acute infarct. Periventricular and deep white matter hyperintensities bilaterally. Some of these are suspicious for multiple sclerosis. Negative for hemorrhage or mass. Ventricle size normal. Vascular: Normal arterial flow void Skull and upper cervical spine: Negative Sinuses/Orbits: Mucosal edema/ secretions in the sphenoid sinus. Otherwise negative Other: None MRA HEAD FINDINGS Both vertebral arteries patent to the basilar without stenosis. PICA patent bilaterally. Basilar widely patent. AICA, superior cerebellar, posterior cerebral artery  is widely patent. Fetal origin right posterior cerebral artery. Internal carotid artery patent bilaterally without stenosis. Negative for cerebral aneurysm. IMPRESSION: Mild white matter disease. Some of these lesions are in the periventricular white matter and are suspicious for multiple sclerosis. Correlate clinically. No acute infarct Negative MRA head Electronically Signed   By: Franchot Gallo M.D.   On: 06/01/2017 12:32   Mr Brain Wo Contrast  Result Date: 06/01/2017 CLINICAL DATA:  Left-sided weakness, blurred vision EXAM: MRI HEAD WITHOUT CONTRAST MRA HEAD WITHOUT CONTRAST TECHNIQUE: Multiplanar, multiecho pulse sequences of the brain and surrounding structures were obtained without intravenous contrast. Angiographic images of the head were obtained using MRA technique without contrast. COMPARISON:  CT head 05/16/2009 FINDINGS: MRI HEAD FINDINGS Brain: Negative for acute infarct. Periventricular and deep white matter hyperintensities bilaterally. Some of these are suspicious for multiple sclerosis. Negative for hemorrhage or mass. Ventricle size normal. Vascular: Normal arterial flow void Skull and upper cervical spine: Negative Sinuses/Orbits: Mucosal edema/ secretions in the sphenoid sinus. Otherwise negative Other: None MRA HEAD FINDINGS Both vertebral arteries patent to the basilar without stenosis. PICA patent bilaterally. Basilar widely patent. AICA, superior cerebellar, posterior cerebral artery is widely patent. Fetal origin right posterior cerebral artery. Internal carotid artery patent bilaterally without stenosis. Negative for cerebral aneurysm. IMPRESSION: Mild white matter disease. Some of these lesions are in the periventricular white matter and are suspicious for multiple sclerosis. Correlate clinically. No acute infarct Negative MRA head Electronically Signed   By: Franchot Gallo M.D.   On: 06/01/2017 12:32   Mr Brain W Contrast  Result Date: 06/01/2017 CLINICAL DATA:  Subacute  left-sided facial droop for the last week. Diplopia. Eye pain. EXAM: MRI HEAD WITH CONTRAST TECHNIQUE: Multiplanar, multiecho pulse sequences of the brain and surrounding structures were obtained with intravenous contrast. CONTRAST:  36mL MULTIHANCE GADOBENATE DIMEGLUMINE 529 MG/ML IV SOLN COMPARISON:  MRI of the brain and MRA head from earlier in the same day. FINDINGS: Brain: The postcontrast images demonstrate no pathologic enhancement. Specifically common non of the focal T2 hyperintensities enhance. There is no significant dural enhancement. No other focal lesions are present. Vascular: Unremarkable Skull and upper cervical spine: No focal bone lesions are present. The upper cervical spine is  unremarkable. The skullbase is within normal limits. Sinuses/Orbits: The paranasal sinuses and mastoid air cells are clear. The globes and orbits are within normal limits. IMPRESSION: 1. No focal pathologic enhancement. 2. No acute abnormality. Electronically Signed   By: San Morelle M.D.   On: 06/01/2017 13:47    Procedures Procedures (including critical care time)  Medications Ordered in ED Medications  LORazepam (ATIVAN) injection 1 mg (1 mg Intravenous Given 06/01/17 1137)  HYDROmorphone (DILAUDID) injection 1 mg (1 mg Intravenous Given 06/01/17 1252)  ondansetron (ZOFRAN) injection 4 mg (4 mg Intravenous Given 06/01/17 1252)  gadobenate dimeglumine (MULTIHANCE) injection 18 mL (18 mLs Intravenous Contrast Given 06/01/17 1315)     Initial Impression / Assessment and Plan / ED Course  I have reviewed the triage vital signs and the nursing notes.  Pertinent labs & imaging results that were available during my care of the patient were reviewed by me and considered in my medical decision making (see chart for details).    Keyuna LAMEEKA SCHLEIFER is a 51 y.o. female who presents to ED for left-sided weakness and double vision just to the left eye x 1 week. Seen by ophthalmology several days ago where exam was  normal, but she was told to come to ER that day for further evaluation. Patient thought symptoms would resolve, therefore did not come to ER. When symptoms were persistent today, she decided to come in for evaluation. MRI was obtained:  IMPRESSION: Mild white matter disease. Some of these lesions are in the periventricular white matter and are suspicious for multiple sclerosis. Correlate clinically. No acute infarct Negative MRA head  Neurology, Dr. Leonel Ramsay, consulted. Appreciate his assistance with patient care today. Please see consultation note for full recommendations. Neuro to see and will obtain further imaging.   Neuro has evaluated patient and further imaging reassuring. Per neurology, safe for discharge home. Likely complex migraine and treat as such while in ED. Recommending 300mg  gabapentin TID. Discussed this with patient who states that she is already taking gabapentin. I offered patient multiple non-narcotic medication options for migraine headache, all of which she stated that she could not take, subsequently declining any further migraine treatment. Evaluation does not show pathology that would require ongoing emergent intervention or inpatient treatment. Discussed follow up care and return precautions with patient. All questions answered.   Patient seen by and discussed with Dr. Gilford Raid who agrees with treatment plan.   Final Clinical Impressions(s) / ED Diagnoses   Final diagnoses:  Left-sided weakness  Double vision    New Prescriptions New Prescriptions   No medications on file     Ward, Ozella Almond, PA-C 06/01/17 1506    Isla Pence, MD 06/01/17 1517

## 2017-06-01 NOTE — Consult Note (Signed)
NEURO HOSPITALIST CONSULT NOTE   Requestig physician: Dr. Jeannine Kitten   Reason for Consult:blurred/double vision   History obtained from:  Patient    HPI:                                                                                                                                          Eileen Douglas is an 51 y.o. female who has seen Dr. Katy Fitch ophthalmology on Thursday due to seeing double vision the day before. I called Dr. Katy Fitch and discussed the case with him personally. He did a dilated optic exam and noted that there was an no optic disc nor nerve abnormalities, she had 20/20 vision. Eyes were lined up normally. She showed no facial droop or lid lag at that time. Due to her concern he recommended that patient, to the hospital as there are multiple etiologies that may cause some of these issues and that issue be further evaluated. As aunt room today she is very labile, complaining of triple and double vision and only her left eye, she is holding her left eyelid closed but able to open it stating that she has photophobia, when asked to smile she has a functional left facial droop which resolves quickly when distracted. Her main complaint now is pressure behind her left eye. Discussing with Dr. Katy Fitch --he denies any intraocular pressure while she was at the office at that time.  Past Medical History:  Diagnosis Date  . Acid reflux   . Anxiety   . Arthritis   . Breast cancer ([place])   . Cervical cancer ([place])   . Chronic pain   . Colitis   . Degenerative disc disease, cervical   . Degenerative disc disease, cervical   . Degenerative disc disease, lumbar   . Fibromyalgia   . GERD (gastroesophageal reflux disease)   . H/O bladder problems   . Hepatitis C   . Hypertension   . Laryngopharyngeal reflux (LPR)   . Neuropathy    in all extremities  . Other voice and resonance disorders   . Polyp of vocal cord and larynx   . Stroke ([place])   . Uterine cancer [facility])      Past Surgical History:  Procedure Laterality Date  . BACK SURGERY    . BREAST BIOPSY    . CARPAL TUNNEL RELEASE    . direct laryngoscopy with stripping of vocal cords    . NECK SURGERY    . RADICAL ABDOMINAL HYSTERECTOMY    . SHOULDER SURGERY    . TONSILLECTOMY    . TONSILLECTOMY AND ADENOIDECTOMY    . tumor removed from throat      Family History  Problem Relation Age of Onset  . Allergic rhinitis Daughter   . Heart disease Father   .  Other Father        Malignant neoplasm  . Stomach cancer Father   . Other Brother        Malignant neoplasm  . Colon cancer Brother   . Mental retardation Unknown   . Migraines Unknown   . Transient ischemic attack Mother       Social History:  reports that she has been smoking.  She has been smoking about 0.25 packs per day. She has never used smokeless tobacco. She reports that she does not drink alcohol or use drugs.  Allergies  Allergen Reactions  . Hydrocodone     MEDICATIONS:                                                                                                                     No current facility-administered medications for this encounter.    Current Outpatient Prescriptions  Medication Sig Dispense Refill  . albuterol (PROVENTIL HFA;VENTOLIN HFA) 108 (90 BASE) MCG/ACT inhaler Inhale 2 puffs into the lungs every 6 (six) hours as needed for wheezing or shortness of breath.    . ALPRAZolam (XANAX) 1 MG tablet Take 1 mg by mouth 2 (two) times daily as needed for anxiety.    Marland Kitchen erythromycin ophthalmic ointment Place 1 application into the left eye 4 (four) times daily. Apply 1cm ribbon to lower eyelid 4 times daily. 3.5 g 0  . esomeprazole (NEXIUM) 40 MG packet Take 40 mg by mouth daily before breakfast. 30 each 6  . lisinopril (PRINIVIL,ZESTRIL) 20 MG tablet Take 20 mg by mouth daily.    . methocarbamol (ROBAXIN) 500 MG tablet Take 500 mg by mouth 2 (two) times daily.    . Oxycodone HCl 10 MG TABS Take 10 mg by  mouth. 1 tablet 5 times a day    . tiZANidine (ZANAFLEX) 4 MG tablet Take 4 mg by mouth every 6 (six) hours as needed for muscle spasms.        ROS:                                                                                                                                       History obtained from the patient  General ROS: negative for - chills, fatigue, fever, night sweats, weight gain or weight loss Psychological ROS: Positive for -  memory difficulties,  Ophthalmic ROS: Positive for - blurry vision, double vision, eye pain or  loss of vision ENT ROS: negative for - epistaxis, nasal discharge, oral lesions, sore throat, tinnitus or vertigo Allergy and Immunology ROS: negative for - hives or itchy/watery eyes Hematological and Lymphatic ROS: negative for - bleeding problems, bruising or swollen lymph nodes Endocrine ROS: negative for - galactorrhea, hair pattern changes, polydipsia/polyuria or temperature intolerance Respiratory ROS: negative for - cough, hemoptysis, shortness of breath or wheezing Cardiovascular ROS: negative for - chest pain, dyspnea on exertion, edema or irregular heartbeat Gastrointestinal ROS: negative for - abdominal pain, diarrhea, hematemesis, nausea/vomiting or stool incontinence Genito-Urinary ROS: negative for - dysuria, hematuria, incontinence or urinary frequency/urgency Musculoskeletal ROS: negative for - joint swelling or muscular weakness Neurological ROS: as noted in HPI Dermatological ROS: negative for rash and skin lesion changes   Blood pressure 139/84, pulse 72, temperature 98.2 F (36.8 C), temperature source Oral, resp. rate 18, height 5\' 10"  (1.778 m), weight 85.7 kg (189 lb), SpO2 100 %.   Neurologic Examination:                                                                                                      HEENT-  Normocephalic, no lesions, without obvious abnormality.  Normal external eye and conjunctiva.  Normal TM's bilaterally.   Normal auditory canals and external ears. Normal external nose, mucus membranes and septum.  Normal pharynx. Cardiovascular- S1, S2 normal, pulses palpable throughout   Lungs- chest clear, no wheezing, rales, normal symmetric air entry Abdomen- normal findings: bowel sounds normal Extremities- no edema Lymph-no adenopathy palpable Musculoskeletal-no joint tenderness, deformity or swelling Skin-warm and dry, no hyperpigmentation, vitiligo, or suspicious lesions  Neurological Examination Mental Status: Alert, oriented, thought content appropriate.  Speech fluent without evidence of aphasia.  Able to follow 3 step commands without difficulty. Cranial Nerves: II:  Blinks to threat bilaterally but holds left eye squinted and states vision is blurry in all fields.  III,IV, VI: ptosis not present, extra-ocular motions intact bilaterally pupils equal, round, reactive to light and accommodation V,VII: smile symmetric, facial light touch sensation normal bilaterally VIII: hearing normal bilaterally IX,X: uvula rises symmetrically XI: bilateral shoulder shrug XII: midline tongue extension Motor: Right : Upper extremity   5/5    Left:     Upper extremity   5/5  Lower extremity   5/5     Lower extremity   5/5 Tone and bulk:normal tone throughout; no atrophy noted Sensory: Pinprick and light touch intact throughout, bilaterally Deep Tendon Reflexes: 2+ and symmetric throughout Plantars: Right: downgoing   Left: downgoing Cerebellar: normal finger-to-nose, normal rapid alternating movements and normal heel-to-shin test Gait: not tested      Lab Results: Basic Metabolic Panel:  Recent Labs Lab 06/01/17 1031  NA 135  K 4.1  CL 103  CO2 25  GLUCOSE 93  BUN 12  CREATININE 0.64  CALCIUM 8.8*    Liver Function Tests: No results for input(s): AST, ALT, ALKPHOS, BILITOT, PROT, ALBUMIN in the last 168 hours. No results for input(s): LIPASE, AMYLASE in the last 168 hours. No results for  input(s): AMMONIA in the last  168 hours.  CBC:  Recent Labs Lab 06/01/17 1031  WBC 6.0  NEUTROABS 2.3  HGB 12.8  HCT 38.4  MCV 89.7  PLT 144*    Cardiac Enzymes: No results for input(s): CKTOTAL, CKMB, CKMBINDEX, TROPONINI in the last 168 hours.  Lipid Panel: No results for input(s): CHOL, TRIG, HDL, CHOLHDL, VLDL, LDLCALC in the last 168 hours.  CBG: No results for input(s): GLUCAP in the last 168 hours.  Microbiology: Results for orders placed or performed during the hospital encounter of 03/18/14  Culture, routine-abscess     Status: None   Collection Time: 03/18/14 11:24 PM  Result Value Ref Range Status   Specimen Description ABSCESS  Final   Special Requests HEAD  Final   Gram Stain   Final    NO WBC SEEN NO SQUAMOUS EPITHELIAL CELLS SEEN RARE GRAM POSITIVE COCCI IN PAIRS IN CLUSTERS Performed at [facility]   Culture   Final    ABUNDANT METHICILLIN RESISTANT STAPHYLOCOCCUS AUREUS Note: RIFAMPIN AND GENTAMICIN SHOULD NOT BE USED AS SINGLE DRUGS FOR TREATMENT OF STAPH INFECTIONS. This organism DOES NOT demonstrate inducible Clindamycin resistance in vitro. CRITICAL RESULT CALLED TO, READ BACK BY AND VERIFIED WITH: PATTY C 5/27 @  900 BY REAMM Performed at [facility]   Report Status 03/21/2014 FINAL  Final   Organism ID, Bacteria METHICILLIN RESISTANT STAPHYLOCOCCUS AUREUS  Final      Susceptibility   Methicillin resistant staphylococcus aureus - MIC*    CLINDAMYCIN <=0.25 SENSITIVE Sensitive     ERYTHROMYCIN >=8 RESISTANT Resistant     GENTAMICIN <=0.5 SENSITIVE Sensitive     LEVOFLOXACIN 4 INTERMEDIATE Intermediate     OXACILLIN >=4 RESISTANT Resistant     PENICILLIN >=0.5 RESISTANT Resistant     RIFAMPIN <=0.5 SENSITIVE Sensitive     TRIMETH/SULFA <=10 SENSITIVE Sensitive     VANCOMYCIN 1 SENSITIVE Sensitive     TETRACYCLINE <=1 SENSITIVE Sensitive     * ABUNDANT METHICILLIN RESISTANT STAPHYLOCOCCUS AUREUS    Coagulation  Studies:  Recent Labs  06/01/17 1031  LABPROT 12.7  INR 0.95    Imaging: Mr Virgel Paling GN Contrast  Result Date: 06/01/2017 CLINICAL DATA:  Left-sided weakness, blurred vision EXAM: MRI HEAD WITHOUT CONTRAST MRA HEAD WITHOUT CONTRAST TECHNIQUE: Multiplanar, multiecho pulse sequences of the brain and surrounding structures were obtained without intravenous contrast. Angiographic images of the head were obtained using MRA technique without contrast. COMPARISON:  CT head 05/16/2009 FINDINGS: MRI HEAD FINDINGS Brain: Negative for acute infarct. Periventricular and deep white matter hyperintensities bilaterally. Some of these are suspicious for multiple sclerosis. Negative for hemorrhage or mass. Ventricle size normal. Vascular: Normal arterial flow void Skull and upper cervical spine: Negative Sinuses/Orbits: Mucosal edema/ secretions in the sphenoid sinus. Otherwise negative Other: None MRA HEAD FINDINGS Both vertebral arteries patent to the basilar without stenosis. PICA patent bilaterally. Basilar widely patent. AICA, superior cerebellar, posterior cerebral artery is widely patent. Fetal origin right posterior cerebral artery. Internal carotid artery patent bilaterally without stenosis. Negative for cerebral aneurysm. IMPRESSION: Mild white matter disease. Some of these lesions are in the periventricular white matter and are suspicious for multiple sclerosis. Correlate clinically. No acute infarct Negative MRA head Electronically Signed   By: Franchot Gallo M.D.   On: 06/01/2017 12:32   Mr Brain Wo Contrast  Result Date: 06/01/2017 CLINICAL DATA:  Left-sided weakness, blurred vision EXAM: MRI HEAD WITHOUT CONTRAST MRA HEAD WITHOUT CONTRAST TECHNIQUE: Multiplanar, multiecho pulse sequences of the brain and surrounding  structures were obtained without intravenous contrast. Angiographic images of the head were obtained using MRA technique without contrast. COMPARISON:  CT head 05/16/2009 FINDINGS: MRI HEAD  FINDINGS Brain: Negative for acute infarct. Periventricular and deep white matter hyperintensities bilaterally. Some of these are suspicious for multiple sclerosis. Negative for hemorrhage or mass. Ventricle size normal. Vascular: Normal arterial flow void Skull and upper cervical spine: Negative Sinuses/Orbits: Mucosal edema/ secretions in the sphenoid sinus. Otherwise negative Other: None MRA HEAD FINDINGS Both vertebral arteries patent to the basilar without stenosis. PICA patent bilaterally. Basilar widely patent. AICA, superior cerebellar, posterior cerebral artery is widely patent. Fetal origin right posterior cerebral artery. Internal carotid artery patent bilaterally without stenosis. Negative for cerebral aneurysm. IMPRESSION: Mild white matter disease. Some of these lesions are in the periventricular white matter and are suspicious for multiple sclerosis. Correlate clinically. No acute infarct Negative MRA head Electronically Signed   By: Franchot Gallo M.D.   On: 06/01/2017 12:32       Assessment and plan per attending neurologist  Etta Quill PA-C [facility] [phone]  06/01/2017, 1:07 PM   Assessment/Plan:  51 yo F with history of complicated migraine presenting with decreased vision in the left eye, multifocal vague neurological symptoms. She has some physical findings not consistent with physiological illness and some components of her history which sound much more consistent with complicated Migraine then other etiologies. At this time, I would favor treating this as complicated migraine, no further neurodiagnostic testing.  The MRI is nonspecific and could be consistent with her history of hypertension at her age, without any enhancing lesions I see no reason to suspect multiple sclerosis.  1) treat as complicated migraine 2) no further recommendations at this time  Roland Rack, MD [facility] [phone]  If 7pm- 7am, please page  neurology on call as listed in [place].

## 2017-06-01 NOTE — Discharge Instructions (Signed)
It was my pleasure taking care of you today!   Please follow up with your primary care provider for further discussion of today's diagnosis.   Return to ER for new or worsening symptoms, any additional concerns.

## 2017-06-01 NOTE — ED Triage Notes (Signed)
Patient complains of left sided facial droop x 1 week. Was seen by ophthalmologist last Thursday and informed that her double vision may be related to optic nerve swelling or aneurysm behind eye. Obvious left sided facial droop since last Tuesday with double vision and eye pain, speech clear and patient alert and oriented

## 2017-10-29 ENCOUNTER — Telehealth: Payer: Self-pay | Admitting: Neurology

## 2017-10-29 NOTE — Telephone Encounter (Signed)
Pt called saying that Dr Felecia Shelling needs to requested a fax from [facility] for medical records. Pt is saying they wouldn't let her get them. Pt is in a lot of pain and very emotional.

## 2017-10-29 NOTE — Telephone Encounter (Signed)
Eileen Douglas has not been seen in our office before.  I did call her today tho, and explained that Dr. Felecia Shelling is able to see her MRI, so she does not need to bring a cd of the images to her appt. on 11/10/17.  Pt. sts. she needs med for chronic pain and h/a.  I have explained to her that our office is not a pain management clinic, and Dr. Felecia Shelling is no longer managing chronic pain patients.  She verbalized understanding of same, will f/u with pcp for a referral to pain mx.  Will keep her 1/16 appt, in our office to discuss abnormal MRI brain./fim

## 2017-11-10 ENCOUNTER — Encounter: Payer: Self-pay | Admitting: Neurology

## 2017-11-10 ENCOUNTER — Other Ambulatory Visit: Payer: Self-pay

## 2017-11-10 ENCOUNTER — Ambulatory Visit (INDEPENDENT_AMBULATORY_CARE_PROVIDER_SITE_OTHER): Payer: Medicaid Other | Admitting: Neurology

## 2017-11-10 VITALS — BP 122/79 | HR 70 | Resp 20 | Ht 70.0 in | Wt 229.0 lb

## 2017-11-10 DIAGNOSIS — E559 Vitamin D deficiency, unspecified: Secondary | ICD-10-CM | POA: Diagnosis not present

## 2017-11-10 DIAGNOSIS — H532 Diplopia: Secondary | ICD-10-CM

## 2017-11-10 DIAGNOSIS — G8929 Other chronic pain: Secondary | ICD-10-CM

## 2017-11-10 DIAGNOSIS — R531 Weakness: Secondary | ICD-10-CM

## 2017-11-10 DIAGNOSIS — M6281 Muscle weakness (generalized): Secondary | ICD-10-CM | POA: Diagnosis not present

## 2017-11-10 DIAGNOSIS — R4781 Slurred speech: Secondary | ICD-10-CM | POA: Diagnosis not present

## 2017-11-10 DIAGNOSIS — R200 Anesthesia of skin: Secondary | ICD-10-CM

## 2017-11-10 DIAGNOSIS — R5383 Other fatigue: Secondary | ICD-10-CM | POA: Diagnosis not present

## 2017-11-10 DIAGNOSIS — R7989 Other specified abnormal findings of blood chemistry: Secondary | ICD-10-CM

## 2017-11-10 DIAGNOSIS — M549 Dorsalgia, unspecified: Secondary | ICD-10-CM

## 2017-11-10 DIAGNOSIS — M545 Low back pain: Secondary | ICD-10-CM | POA: Diagnosis not present

## 2017-11-10 NOTE — Progress Notes (Signed)
[facility]  PATIENT: Eileen Douglas DOB: 08-01-1966  REFERRING DOCTOR OR PCP:  Nicholes Rough SOURCE:    Patient, notes from the ED, Imaging and lab reports, MRI images on PACS.  _________________________________   HISTORICAL  CHIEF COMPLAINT:  Chief Complaint  Patient presents with  . Abnormal MRI    Sts. one morning in July she awoke with confusion, pain left eye, gait/balance disturbance, pain in bilat hands, speech disturbance.  Sts. saw her eye doctor who told her she had an aneurysm.  Sent to the hospital for an MRI and sts. was told by one person that she may have MS, told by another that spots on MRI are consistent with migraines.  C/O worsening depression after grandaughter she was raising was removed from her home./fim    HISTORY OF PRESENT ILLNESS:  I had the pleasure seeing you patient, Eileen Douglas, Eileen Douglas [facility] for neurologic consultation regarding her neurologic symptoms and abnormal MRI.  In July, she had the onset of diplopia and didn't feel right.  She also felt weak on her left.  She saw her ophthalmologist who was concerned about an aneurysm and sent her to the ED.   She had an MRI that showed some white matter changes, none acute   The MRA was normal.   She feels foggy and disoriented. She feels she has trouble getting words out.  She states her voice has been slurred   She feels generally weak, worse in her legs.   She is using a walker.   She is off balanced and has had some falls.     She feels numb in both hands, left worse than right.     She notes urinary urgency and has some leakage incontinence.  She has felt exhausted since last summer.   She is up all night, she states due to pain.  She was on opiates but stopped due to custody issues with her grandchild.        She is very depressed.   She is on Pristiq.    She has insomnia.    She is on trazodone and xanax at night.       She reports back and leg pain and was on  oxycodone.    She is on gabapentin 300 mg tid.   She is also on tizanidine  I personally reviewed the MRI from 05/2017.   It shows some white matter foci, some periventricular.   None enhanced.   The MRA does not show   She has had essential HTN for about 5 years, worse the past 2-3 years.       REVIEW OF SYSTEMS: Constitutional: No fevers, chills, sweats, or change in appetite.   Fatigue.   Poor sleep Eyes: No visual changes, double vision, eye pain Ear, nose and throat: No hearing loss, ear pain, nasal congestion, sore throat Cardiovascular: No chest pain, palpitations Respiratory: No shortness of breath at rest or with exertion.   No wheezes GastrointestinaI: No nausea, vomiting, diarrhea, abdominal pain, fecal incontinence Genitourinary: No dysuria, urinary retention or frequency.  No nocturia. Musculoskeletal: No neck pain, back pain Integumentary: No rash, pruritus, skin lesions Neurological: as above Psychiatric: Notes depression. Endocrine: No palpitations, diaphoresis, change in appetite, change in weigh or increased thirst Hematologic/Lymphatic: No anemia, purpura, petechiae. Allergic/Immunologic: No itchy/runny eyes, nasal congestion, recent allergic reactions, rashes  ALLERGIES: Allergies  Allergen Reactions  . Hydrocodone     HOME MEDICATIONS:  Current Outpatient Medications:  .  ALPRAZolam (XANAX) 1 MG tablet, Take 1 mg by mouth 2 (two) times daily as needed for anxiety., Disp: , Rfl:  .  ANORO ELLIPTA 62.5-25 MCG/INH AEPB, , Disp: , Rfl:  .  b complex vitamins tablet, Take 1 tablet by mouth daily., Disp: , Rfl:  .  desvenlafaxine (PRISTIQ) 100 MG 24 hr tablet, , Disp: , Rfl:  .  DEXILANT 60 MG capsule, , Disp: , Rfl:  .  gabapentin (NEURONTIN) 300 MG capsule, , Disp: , Rfl:  .  lisinopril (PRINIVIL,ZESTRIL) 20 MG tablet, Take 20 mg by mouth daily., Disp: , Rfl:  .  Multiple Vitamins-Minerals (CENTRUM SILVER 50+WOMEN PO), Take by mouth daily., Disp: , Rfl:  .   naloxone (NARCAN) nasal spray 4 mg/0.1 mL, Place into the nose., Disp: , Rfl:  .  tiZANidine (ZANAFLEX) 4 MG tablet, Take by mouth., Disp: , Rfl:  .  traZODone (DESYREL) 100 MG tablet, , Disp: , Rfl:  .  albuterol (PROVENTIL HFA;VENTOLIN HFA) 108 (90 BASE) MCG/ACT inhaler, Inhale 2 puffs into the lungs every 6 (six) hours as needed for wheezing or shortness of breath., Disp: , Rfl:  .  erythromycin ophthalmic ointment, Place 1 application into the left eye 4 (four) times daily. Apply 1cm ribbon to lower eyelid 4 times daily., Disp: 3.5 g, Rfl: 0 .  esomeprazole (NEXIUM) 40 MG packet, Take 40 mg by mouth daily before breakfast., Disp: 30 each, Rfl: 6  PAST MEDICAL HISTORY: Past Medical History:  Diagnosis Date  . Acid reflux   . Anxiety   . Arthritis   . Breast cancer ([place])   . Cervical cancer ([place])   . Chronic pain   . Colitis   . Degenerative disc disease, cervical   . Degenerative disc disease, cervical   . Degenerative disc disease, lumbar   . Fibromyalgia   . GERD (gastroesophageal reflux disease)   . H/O bladder problems   . Hepatitis C   . Hypertension   . Laryngopharyngeal reflux (LPR)   . Neuropathy    in all extremities  . Other voice and resonance disorders   . Polyp of vocal cord and larynx   . Stroke ([place])   . Uterine cancer ([place])     PAST SURGICAL HISTORY: Past Surgical History:  Procedure Laterality Date  . BACK SURGERY    . BREAST BIOPSY    . CARPAL TUNNEL RELEASE    . direct laryngoscopy with stripping of vocal cords    . NECK SURGERY    . RADICAL ABDOMINAL HYSTERECTOMY    . SHOULDER SURGERY    . TONSILLECTOMY    . TONSILLECTOMY AND ADENOIDECTOMY    . tumor removed from throat      FAMILY HISTORY: Family History  Problem Relation Age of Onset  . Allergic rhinitis Daughter   . Heart disease Father   . Other Father        Malignant neoplasm  . Stomach cancer Father   . Other Brother        Malignant neoplasm  . Colon cancer Brother   . Mental  retardation Unknown   . Migraines Unknown   . Transient ischemic attack Mother     SOCIAL HISTORY:  Social History   Socioeconomic History  . Marital status: Single    Spouse name: Not on file  . Number of children: Not on file  . Years of education: Not on file  . Highest education level: Not on file  Social Needs  . Financial  resource strain: Not on file  . Food insecurity - worry: Not on file  . Food insecurity - inability: Not on file  . Transportation needs - medical: Not on file  . Transportation needs - non-medical: Not on file  Occupational History  . Not on file  Tobacco Use  . Smoking status: Current Every Day Smoker    Packs/day: 0.25  . Smokeless tobacco: Never Used  Substance and Sexual Activity  . Alcohol use: No    Alcohol/week: 0.0 oz  . Drug use: No  . Sexual activity: Yes    Birth control/protection: Surgical  Other Topics Concern  . Not on file  Social History Narrative  . Not on file     PHYSICAL EXAM  Vitals:   11/10/17 1318  BP: 122/79  Pulse: 70  Resp: 20  Weight: 229 lb (103.9 kg)  Height: 5\' 10"  (1.778 m)    Body mass index is 32.86 kg/m.   General: The patient is well-developed and well-nourished and in no acute distress  Eyes:  Funduscopic exam shows normal optic discs and retinal vessels.  Neck: The neck is supple, no carotid bruits are noted.  The neck is nontender.  Cardiovascular: The heart has a regular rate and rhythm with a normal S1 and S2. There were no murmurs, gallops or rubs. Lungs are clear to auscultation.  Skin: Extremities are without significant edema.  Musculoskeletal:  Back is nontender  Neurologic Exam  Mental status: The patient is alert and oriented x 3 at the time of the examination. The patient has apparent normal recent and remote memory, with an apparently normal attention span and concentration ability.   Speech is normal.  Cranial nerves: Extraocular movements are full. Pupils are equal,  round, and reactive to light and accomodation.  Visual fields are full.  Facial symmetry is present. There is good facial sensation to soft touch bilaterally.Facial strength is normal. She is slurring her words some but then will have her do time her speech is normal. Trapezius and sternocleidomastoid strength is normal.    The tongue is midline, and the patient has symmetric elevation of the soft palate. No obvious hearing deficits are noted.  Motor:  Muscle bulk is normal.   Tone is normal. Strength is  5 / 5 in all 4 extremities.   Sensory: She reports reduced touch, temperature and vibration in the left arm and leg..  Coordination: Cerebellar testing reveals good finger-nose-finger and heel-to-shin bilaterally.  Gait and station: Station is normal.   The gait is mildly wide and arthritic. She is using a walker.. Romberg is negative.   Reflexes: Deep tendon reflexes are symmetric and normal bilaterally.   Plantar responses are flexor.    DIAGNOSTIC DATA (LABS, IMAGING, TESTING) - I reviewed patient records, labs, notes, testing and imaging myself where available.  Lab Results  Component Value Date   WBC 6.0 06/01/2017   HGB 12.8 06/01/2017   HCT 38.4 06/01/2017   MCV 89.7 06/01/2017   PLT 144 (L) 06/01/2017      Component Value Date/Time   NA 135 06/01/2017 1031   NA 134 (L) 01/20/2013 1321   K 4.1 06/01/2017 1031   K 4.4 01/20/2013 1321   CL 103 06/01/2017 1031   CL 100 01/20/2013 1321   CO2 25 06/01/2017 1031   CO2 30 01/20/2013 1321   GLUCOSE 93 06/01/2017 1031   GLUCOSE 77 01/20/2013 1321   BUN 12 06/01/2017 1031   BUN 14 01/20/2013 1321  CREATININE 0.64 06/01/2017 1031   CREATININE 0.63 01/20/2013 1321   CALCIUM 8.8 (L) 06/01/2017 1031   CALCIUM 8.7 01/20/2013 1321   PROT 7.8 06/25/2015 1449   PROT 7.2 01/20/2013 1321   ALBUMIN 4.1 06/25/2015 1449   ALBUMIN 3.5 01/20/2013 1321   AST 169 (H) 06/25/2015 1449   AST 43 (H) 01/20/2013 1321   ALT 303 (H) 06/25/2015  1449   ALT 56 01/20/2013 1321   ALKPHOS 87 06/25/2015 1449   ALKPHOS 84 01/20/2013 1321   BILITOT 0.6 06/25/2015 1449   BILITOT 0.4 01/20/2013 1321   GFRNONAA >60 06/01/2017 1031   GFRNONAA >60 01/20/2013 1321   GFRAA >60 06/01/2017 1031   GFRAA >60 01/20/2013 1321   No results found for: CHOL, HDL, LDLCALC, LDLDIRECT, TRIG, CHOLHDL No results found for: HGBA1C Lab Results  Component Value Date   VITAMINB12 625 06/25/2015   Lab Results  Component Value Date   TSH 1.20 06/25/2015       ASSESSMENT AND PLAN  Muscle weakness (generalized)  Diplopia  General weakness  Other fatigue  Chronic midline low back pain, with sciatica presence unspecified  Slurred speech    In summary, Eileen Douglas is a 52 year old woman with multiple somatic complaints including weakness, numbness, pain, vision problems, fatigue.. The subjective part of her physical exam was mostly normal. She did report decreased sensation on the left side of her body and her gait was a little wide.  She had functional slurred speech that was normal at times.    The MRI is abnormal in a nonspecific way. She does have some white matter foci and some of them are periventricular. These changes could be due to chronic microvascular ischemic changes (smoker, hypertension, age, migraine) or demyelination. If due to demyelination, I would expect significant changes over time and we will check an MRI of the brain and compare to the one from last year.   If there are significant changes consistent with MS, then I will begin a disease modifying therapy.   If milder changes or nonspecific changes, we could also consider a lumbar puncture to determine if the CSF is consistent with a demyelinating disorder.    I will check an MRI of the cervical spine to rule out a compressive or demyelinating myelopathy.    Blood work will be checked for vasculitis and vitamin deficiency and thyroid deficiency.     We will call her with the results of  the studies.    She will return to see me in 2 months but I will bring her back sooner if indicated based on the studies or refer her appropriately.  Thank you for asking me to see Eileen Douglas. Please let me know if I can be of further assistance with her or other patients in the future  Richard A. Felecia Shelling, MD, [facility] 3/54/6568, 1:27 PM Certified in Neurology, Clinical Neurophysiology, Sleep Medicine, Pain Medicine and Neuroimaging  [facility] [address] [place], [place] [address] [phone]

## 2017-11-11 LAB — TSH: TSH: 3.25 u[IU]/mL (ref 0.450–4.500)

## 2017-11-11 LAB — VITAMIN D 25 HYDROXY (VIT D DEFICIENCY, FRACTURES): Vit D, 25-Hydroxy: 36.1 ng/mL (ref 30.0–100.0)

## 2017-11-11 LAB — VITAMIN B12: VITAMIN B 12: 554 pg/mL (ref 232–1245)

## 2017-11-11 LAB — T4: T4 TOTAL: 8.7 ug/dL (ref 4.5–12.0)

## 2017-11-11 LAB — ANA W/REFLEX: Anti Nuclear Antibody(ANA): NEGATIVE

## 2017-11-15 ENCOUNTER — Telehealth: Payer: Self-pay | Admitting: *Deleted

## 2017-11-15 NOTE — Telephone Encounter (Signed)
Noted/fim 

## 2017-11-15 NOTE — Telephone Encounter (Signed)
-----   Message from Britt Bottom, MD sent at 11/12/2017  1:23 PM EST ----- Please let the patient know that the lab work is fine.

## 2017-11-15 NOTE — Telephone Encounter (Signed)
Spoke with [facility] today and reviewed below lab results.  She verbalized understanding of same, asked to speak with someone about scheduling MRI's.  Will ask Raquel Sarna to f/u with this/fim

## 2017-11-30 ENCOUNTER — Other Ambulatory Visit: Payer: Medicare Other

## 2017-12-03 ENCOUNTER — Ambulatory Visit
Admission: RE | Admit: 2017-12-03 | Discharge: 2017-12-03 | Disposition: A | Discharge status: Home / Self Care | Payer: Medicare Other | Source: Ambulatory Visit | Attending: Neurology | Admitting: Neurology

## 2017-12-03 DIAGNOSIS — M6281 Muscle weakness (generalized): Secondary | ICD-10-CM

## 2017-12-03 MED ORDER — GADOBENATE DIMEGLUMINE 529 MG/ML IV SOLN
20.0000 mL | Freq: Once | INTRAVENOUS | Status: AC | PRN
  Administered 2017-12-03: 20 mL via INTRAVENOUS

## 2017-12-06 ENCOUNTER — Telehealth: Payer: Self-pay | Admitting: *Deleted

## 2017-12-06 ENCOUNTER — Inpatient Hospital Stay: Admission: RE | Admit: 2017-12-06 | Payer: Medicare Other | Source: Ambulatory Visit

## 2017-12-06 DIAGNOSIS — M4802 Spinal stenosis, cervical region: Secondary | ICD-10-CM

## 2017-12-06 DIAGNOSIS — M5030 Other cervical disc degeneration, unspecified cervical region: Secondary | ICD-10-CM

## 2017-12-06 DIAGNOSIS — M542 Cervicalgia: Secondary | ICD-10-CM

## 2017-12-06 MED ORDER — METHYLPREDNISOLONE 4 MG PO TABS: ORAL_TABLET | ORAL | 0 refills | Status: AC

## 2017-12-06 NOTE — Telephone Encounter (Signed)
-----   Message from Britt Bottom, MD sent at 12/05/2017  3:02 PM EST ----- Please let her know that the MRI does show some degenerative changes in the spine, worse at C5-C6 where she has spinal stenosis.   Although there is no definite nerve root compression, the right C6 and right C7 nerve roots do not have as much room as it should. Please call in a steroid pack and referr to physical therapy. If symptoms persist we could refer for an epidural steroid injection

## 2017-12-06 NOTE — Telephone Encounter (Signed)
Spoke with Maahi and reviewed below MRI results.  She verbalized understanding of same.  Agreeable to PT, although she doesn't feel it helped in the past.  Medrol dose pk faxed to [facility] in [place] per her request/fim

## 2017-12-22 ENCOUNTER — Other Ambulatory Visit: Payer: Medicare Other

## 2017-12-22 NOTE — Addendum Note (Signed)
Addended by: France Ravens I on: 12/22/2017 04:44 PM   Modules accepted: Orders

## 2017-12-22 NOTE — Telephone Encounter (Signed)
Spoke with Eileen Douglas and per RAS, offered cervical esi.  She is agreeable.  Order placed in Epic/fim

## 2017-12-22 NOTE — Telephone Encounter (Signed)
Pt called she is in severe pain and has been "bedbound" since Friday. Pt said she is having pain in the neck, shoulder, hands and low back. She is not wanting to have PT said she had it in the past and it did not help. She is wanting to move forward with epidural steroid injection. Please call to advise

## 2017-12-22 NOTE — Telephone Encounter (Signed)
MRI c-spine showed degen changes worse at C5-6, no def nerve root compression.  Pt. initially agreed to PT, now declines it. Has had Medrol dose pk. Will check with RAS and call her back/fim

## 2018-01-11 ENCOUNTER — Ambulatory Visit
Admission: RE | Admit: 2018-01-11 | Discharge: 2018-01-11 | Disposition: A | Discharge status: Home / Self Care | Payer: Medicaid Other | Source: Ambulatory Visit | Attending: Neurology | Admitting: Neurology

## 2018-01-11 DIAGNOSIS — M6281 Muscle weakness (generalized): Secondary | ICD-10-CM | POA: Diagnosis not present

## 2018-01-11 DIAGNOSIS — H532 Diplopia: Secondary | ICD-10-CM | POA: Diagnosis not present

## 2018-01-11 MED ORDER — GADOBENATE DIMEGLUMINE 529 MG/ML IV SOLN
20.0000 mL | Freq: Once | INTRAVENOUS | Status: AC | PRN
  Administered 2018-01-11: 20 mL via INTRAVENOUS

## 2018-01-12 ENCOUNTER — Ambulatory Visit: Payer: Medicare Other | Admitting: Neurology

## 2018-01-12 ENCOUNTER — Telehealth: Payer: Self-pay | Admitting: *Deleted

## 2018-01-12 ENCOUNTER — Encounter: Payer: Self-pay | Admitting: *Deleted

## 2018-01-12 ENCOUNTER — Encounter: Payer: Self-pay | Admitting: Neurology

## 2018-01-12 NOTE — Telephone Encounter (Signed)
Spoke with a female at home# listed for pt.  She sts. this is not pt's phone #.  Sts. she knows pt. but this is not her #.  I have asked if there is a # I can reach pt. at, but she sts. no.  I spoke with a female at pt's cell# who sts. this is also a wrong #. Unable to contact letter sent/fim

## 2018-01-12 NOTE — Telephone Encounter (Signed)
-----   Message from Britt Bottom, MD sent at 01/12/2018  1:26 PM EDT ----- Please let her know that the MRI of the brain did not show anything new compared to the one last year

## 2018-02-04 ENCOUNTER — Emergency Department
Admission: EM | Admit: 2018-02-04 | Discharge: 2018-02-04 | Disposition: A | Discharge status: Home / Self Care | Payer: Medicaid Other | Attending: Emergency Medicine | Admitting: Emergency Medicine

## 2018-02-04 ENCOUNTER — Encounter: Payer: Self-pay | Admitting: Emergency Medicine

## 2018-02-04 ENCOUNTER — Emergency Department: Payer: Medicaid Other

## 2018-02-04 DIAGNOSIS — Z79899 Other long term (current) drug therapy: Secondary | ICD-10-CM | POA: Insufficient documentation

## 2018-02-04 DIAGNOSIS — Y999 Unspecified external cause status: Secondary | ICD-10-CM | POA: Insufficient documentation

## 2018-02-04 DIAGNOSIS — Z853 Personal history of malignant neoplasm of breast: Secondary | ICD-10-CM | POA: Diagnosis not present

## 2018-02-04 DIAGNOSIS — F17210 Nicotine dependence, cigarettes, uncomplicated: Secondary | ICD-10-CM | POA: Insufficient documentation

## 2018-02-04 DIAGNOSIS — W231XXA Caught, crushed, jammed, or pinched between stationary objects, initial encounter: Secondary | ICD-10-CM | POA: Diagnosis not present

## 2018-02-04 DIAGNOSIS — Y92512 Supermarket, store or market as the place of occurrence of the external cause: Secondary | ICD-10-CM | POA: Diagnosis not present

## 2018-02-04 DIAGNOSIS — Y9389 Activity, other specified: Secondary | ICD-10-CM | POA: Diagnosis not present

## 2018-02-04 DIAGNOSIS — S39012A Strain of muscle, fascia and tendon of lower back, initial encounter: Secondary | ICD-10-CM | POA: Insufficient documentation

## 2018-02-04 DIAGNOSIS — W010XXA Fall on same level from slipping, tripping and stumbling without subsequent striking against object, initial encounter: Secondary | ICD-10-CM | POA: Insufficient documentation

## 2018-02-04 DIAGNOSIS — I10 Essential (primary) hypertension: Secondary | ICD-10-CM | POA: Insufficient documentation

## 2018-02-04 DIAGNOSIS — W19XXXA Unspecified fall, initial encounter: Secondary | ICD-10-CM

## 2018-02-04 DIAGNOSIS — S3992XA Unspecified injury of lower back, initial encounter: Secondary | ICD-10-CM | POA: Diagnosis present

## 2018-02-04 MED ORDER — OXYCODONE HCL 5 MG PO TABS
ORAL_TABLET | ORAL | Status: AC
  Filled 2018-02-04: qty 1

## 2018-02-04 MED ORDER — PREDNISONE 10 MG PO TABS: 10.0000 mg | ORAL_TABLET | Freq: Two times a day (BID) | ORAL | 0 refills | Status: AC

## 2018-02-04 MED ORDER — OXYCODONE HCL 5 MG PO TABS: 5.0000 mg | ORAL_TABLET | Freq: Three times a day (TID) | ORAL | 0 refills | Status: AC | PRN

## 2018-02-04 MED ORDER — OXYCODONE HCL 5 MG PO TABS
5.0000 mg | ORAL_TABLET | Freq: Once | ORAL | Status: AC
  Administered 2018-02-04: 5 mg via ORAL
  Filled 2018-02-04: qty 1

## 2018-02-04 NOTE — ED Notes (Signed)
Linear bruise to right lower outer leg.  Small bruise to left forearm noted. No other visible bruising.

## 2018-02-04 NOTE — ED Notes (Addendum)
Pt got foot caught in pallet at store and fell. Hit head, no LOC. Pain to right shoulder and outer right thigh/lower back. Has chronic back pain.  Pt getting undressed so can see wounds.

## 2018-02-04 NOTE — Discharge Instructions (Addendum)
Your exam and x-rays are consistent with a lumbar strain. There is no evidence of fracture or dislocation. You may expect to be sore for a few days. Apply ice to any sore muscles. Follow-up with your provider for ongoing symptoms.

## 2018-02-04 NOTE — ED Provider Notes (Signed)
[facility] Emergency Department Provider Note ____________________________________________  Time seen: 1600  I have reviewed the triage vital signs and the nursing notes.  HISTORY  Chief Complaint  Fall  HPI Eileen Douglas is a 52 y.o. female resents to the ED accompanied by her husband, for evaluation of injury sustained following mechanical fall.  Patient was at a local grocery store, when she describes getting her foot caught in the kick plate around a palate of fruit.  This caused her to fall, landing as she describes on her left buttocks.  She describes hitting her head but denies any head laceration or hematoma.  She presents with some mild neck pain but primarily complains of pain to the lower back and left buttocks.  She denies any distal paresthesias, footdrop, or incontinence.  She gives a history of degenerative disc disease of the lumbar spine, and claims to have had a multilevel lumbar fusion.  There is no surgical scar to suggest she has had an invasive procedure.  Past Medical History:  Diagnosis Date  . Acid reflux   . Anxiety   . Arthritis   . Breast cancer ([place])   . Cervical cancer ([place])   . Chronic pain   . Colitis   . Degenerative disc disease, cervical   . Degenerative disc disease, cervical   . Degenerative disc disease, lumbar   . Fibromyalgia   . GERD (gastroesophageal reflux disease)   . H/O bladder problems   . Hepatitis C   . Hypertension   . Laryngopharyngeal reflux (LPR)   . Neuropathy    in all extremities  . Other voice and resonance disorders   . Polyp of vocal cord and larynx   . Stroke ([place])   . Uterine cancer [facility])     Patient Active Problem List   Diagnosis Date Noted  . General weakness 11/10/2017  . Other fatigue 11/10/2017  . Diplopia 11/10/2017  . Chronic back pain 11/10/2017  . Slurred speech 11/10/2017  . Vitamin D deficiency 11/10/2017  . Depression 06/14/2007  . GERD 06/14/2007  . [place]  DISEASE 06/14/2007  . Neck pain 06/14/2007  . BACK PAIN, CHRONIC, HX OF 06/14/2007    Past Surgical History:  Procedure Laterality Date  . BACK SURGERY    . BREAST BIOPSY    . CARPAL TUNNEL RELEASE    . direct laryngoscopy with stripping of vocal cords    . NECK SURGERY    . RADICAL ABDOMINAL HYSTERECTOMY    . SHOULDER SURGERY    . TONSILLECTOMY    . TONSILLECTOMY AND ADENOIDECTOMY    . tumor removed from throat      Prior to Admission medications   Medication Sig Start Date End Date Taking? Authorizing Provider  albuterol (PROVENTIL HFA;VENTOLIN HFA) 108 (90 BASE) MCG/ACT inhaler Inhale 2 puffs into the lungs every 6 (six) hours as needed for wheezing or shortness of breath.    [provider]  ALPRAZolam Duanne Moron) 1 MG tablet Take 1 mg by mouth 2 (two) times daily as needed for anxiety.    [provider]  Celedonio Miyamoto 62.5-25 MCG/INH AEPB  10/12/17   [provider]  b complex vitamins tablet Take 1 tablet by mouth daily.    [provider]  desvenlafaxine (PRISTIQ) 100 MG 24 hr tablet  09/01/17   [provider]  DEXILANT 60 MG capsule  10/23/17   [provider]  erythromycin ophthalmic ointment Place 1 application into the left eye  4 (four) times daily. Apply 1cm ribbon to lower eyelid 4 times daily. 08/09/16   Hagler, Jami L, PA-C  esomeprazole (NEXIUM) 40 MG packet Take 40 mg by mouth daily before breakfast. 06/25/15   Hvozdovic, Lori P, PA-C  gabapentin (NEURONTIN) 300 MG capsule  10/31/17   [provider]  lisinopril (PRINIVIL,ZESTRIL) 20 MG tablet Take 20 mg by mouth daily.    [provider]  methylPREDNISolone (MEDROL) 4 MG tablet Take 6 tablets in divided doses on day 1, 5 tablets in divided doses on day 2, 4 tablets in divided doses on day 3, 3 tablets in divided doses on day 4, 2 tablets on day 5, and 1 tablet on day 6. 12/06/17   Sater, Nanine Means, MD  Multiple Vitamins-Minerals (CENTRUM SILVER 50+WOMEN  PO) Take by mouth daily.    [provider]  naloxone [facility]) nasal spray 4 mg/0.1 mL Place into the nose. 05/05/17   [provider]  oxyCODONE (ROXICODONE) 5 MG immediate release tablet Take 1 tablet (5 mg total) by mouth 3 (three) times daily as needed for up to 3 days for severe pain. 02/04/18 02/07/18  Ethon Wymer, Dannielle Karvonen, PA-C  predniSONE (DELTASONE) 10 MG tablet Take 1 tablet (10 mg total) by mouth 2 (two) times daily with a meal. 02/04/18   Foy Vanduyne, Dannielle Karvonen, PA-C  tiZANidine (ZANAFLEX) 4 MG tablet Take by mouth. 12/27/14   [provider]  traZODone (DESYREL) 100 MG tablet  09/08/17   [provider]    Allergies Hydrocodone  Family History  Problem Relation Age of Onset  . Allergic rhinitis Daughter   . Heart disease Father   . Other Father        Malignant neoplasm  . Stomach cancer Father   . Other Brother        Malignant neoplasm  . Colon cancer Brother   . Mental retardation Unknown   . Migraines Unknown   . Transient ischemic attack Mother     Social History Social History   Tobacco Use  . Smoking status: Current Every Day Smoker    Packs/day: 0.25  . Smokeless tobacco: Never Used  Substance Use Topics  . Alcohol use: No    Alcohol/week: 0.0 oz  . Drug use: No    Review of Systems  Constitutional: Negative for fever. Eyes: Negative for visual changes. ENT: Negative for sore throat. Cardiovascular: Negative for chest pain. Respiratory: Negative for shortness of breath. Gastrointestinal: Negative for abdominal pain, vomiting and diarrhea. Genitourinary: Negative for dysuria. Musculoskeletal: Negative for back pain. Skin: Negative for rash. Neurological: Negative for headaches, focal weakness or numbness. ____________________________________________  PHYSICAL EXAM:  VITAL SIGNS: ED Triage Vitals [02/04/18 1524]  Enc Vitals Group     BP 131/71     Pulse Rate 84     Resp 20     Temp 97.7 F (36.5 C)      Temp Source Oral     SpO2 97 %     Weight 225 lb (102.1 kg)     Height 5\' 8"  (1.727 m)     Head Circumference      Peak Flow      Pain Score 10     Pain Loc      Pain Edu?      Excl. in [place]?     Constitutional: Alert and oriented. Well appearing and in no distress. Head: Normocephalic and atraumatic. Eyes: Conjunctivae are normal. PERRL. Normal extraocular movements Neck: Supple.  No thyromegaly. Normal ROM without crepitus.  Distracting midline injuries noted.   Cardiovascular: Normal rate, regular rhythm. Normal distal pulses. Respiratory: Normal respiratory effort. No wheezes/rales/rhonchi. Gastrointestinal: Soft and nontender. No distention. Musculoskeletal: Normal spinal alignment without midline tenderness, spasm, deformity, or step-off.  Patient transitions from supine to sit without assistance.  She is able to demonstrate a hip gluten bridge on the exam table.  Normal hip internal and external rotation noted.  Nontender with normal range of motion in all extremities.  Neurologic: Cranial nerves II through XII grossly intact.  LE DTRs bilaterally.  Negative supine straight leg raise bilaterally.  Normal toe dorsiflexion and foot eversion on exam.  Normal gait without ataxia. Normal speech and language. No gross focal neurologic deficits are appreciated. Skin:  Skin is warm, dry and intact. No rash noted.  No midline lumbar spine to suggest surgery. ____________________________________________   RADIOLOGY  Lumbar Spine  IMPRESSION: Mild lumbar scoliosis concave left. Diffuse degenerative change. No acute abnormality. ____________________________________________  PROCEDURES  Procedures Oxycodone 5 mg IR PO ____________________________________________  INITIAL IMPRESSION / ASSESSMENT AND PLAN / ED COURSE  Patient with ED evaluation of injury sustained following a mechanical fall.  Patient's exam and x-ray are reassuring at this time.  No acute fracture dislocation is  appreciated.  Patient symptoms likely represent musculoskeletal strain and low back pain.  She will be discharged with a small prescription for oxycodone to dose as needed for pain.  She will follow-up with her primary care provider for ongoing symptom management.  I reviewed the patient's prescription history over the last 12 months in the multi-state controlled substances database(s) that includes [place], [place], [place], [place], [place], [place], [place], [place], [place], [place], [place], [place], [place], and [place].  Results were notable for monthly alprazolam prescriptions. ____________________________________________  FINAL CLINICAL IMPRESSION(S) / ED DIAGNOSES  Final diagnoses:  Fall, initial encounter  Lumbar strain, initial encounter      Melvenia Needles, PA-C 02/04/18 1726    Delman Kitten, MD 02/04/18 1828

## 2018-02-04 NOTE — ED Triage Notes (Signed)
Patient presents to the ED with right sided pain post fall.  Patient states she was grocery shopping at [facility] and states her foot got caught in a "kick plate" and she fell.  Patient reports hitting her head, denies passing out.  Patient ambulatory with limp.

## 2018-03-22 ENCOUNTER — Encounter: Payer: Self-pay | Admitting: Physician Assistant

## 2018-05-17 ENCOUNTER — Encounter: Payer: Self-pay | Admitting: *Deleted

## 2019-12-31 IMAGING — CR DG LUMBAR SPINE COMPLETE 4+V
5 series · 5 of 5 positions shown · non-contrast
Comparison: 05/12/2010.

CLINICAL DATA: Fall.  Pain with radiation into right leg.

EXAM:
LUMBAR SPINE - COMPLETE 4+ VIEW

[l-spine ap]
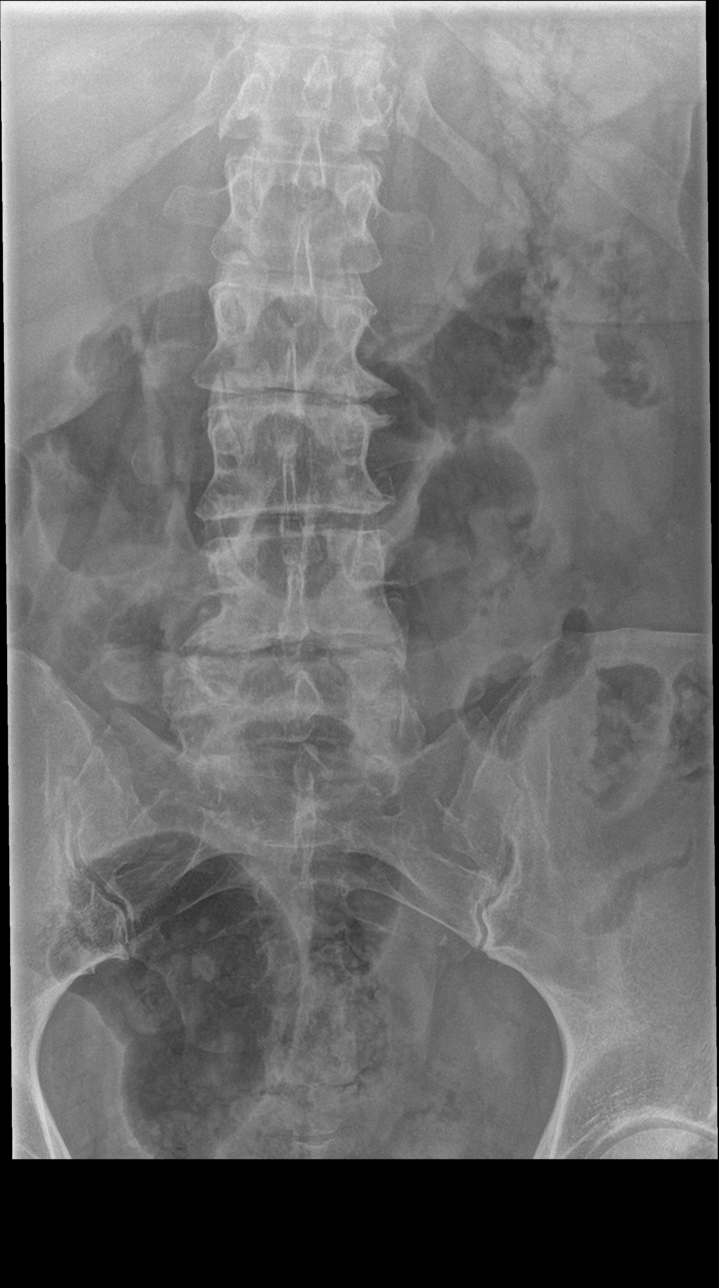

[l-spine obl (1 of 2)]
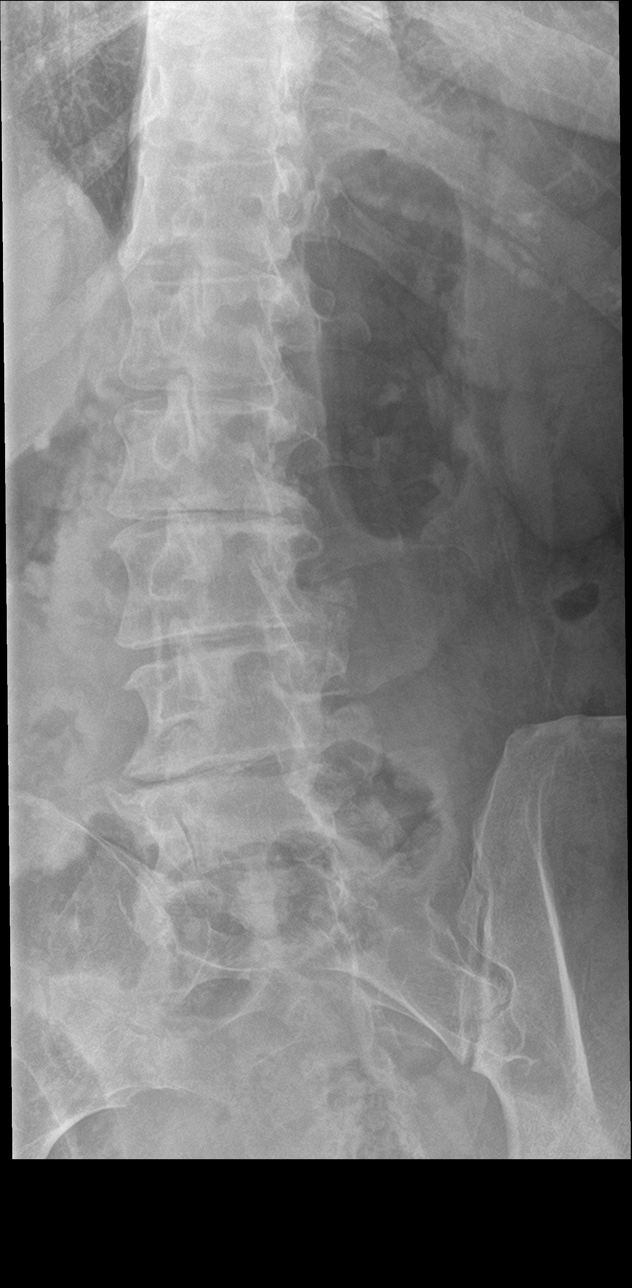

[l-spine obl (2 of 2)]
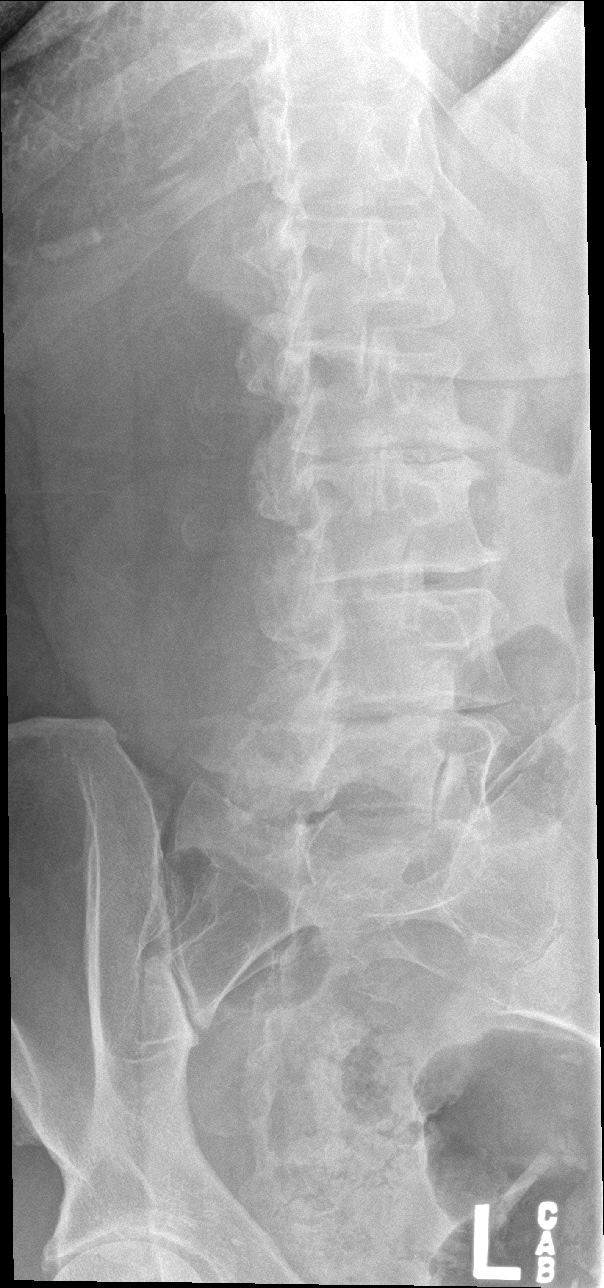

[l-spine lat]
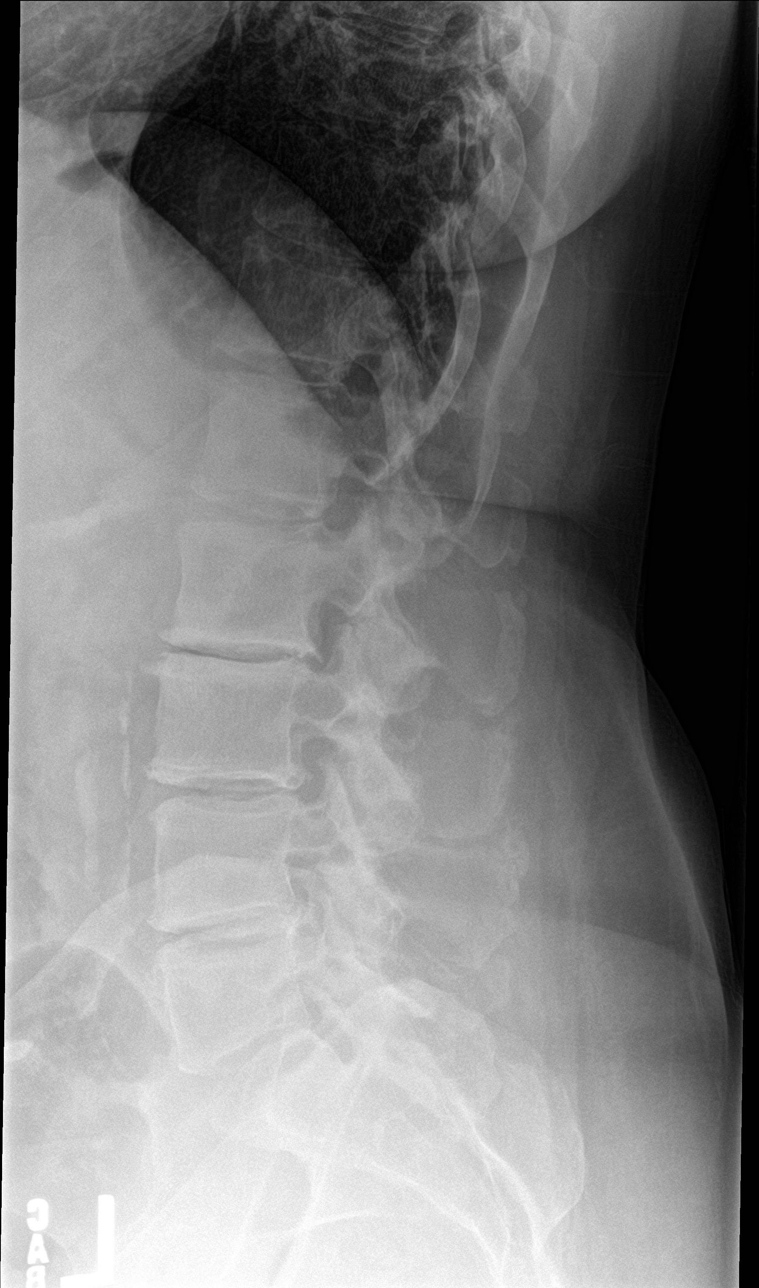

[l-spine spot]
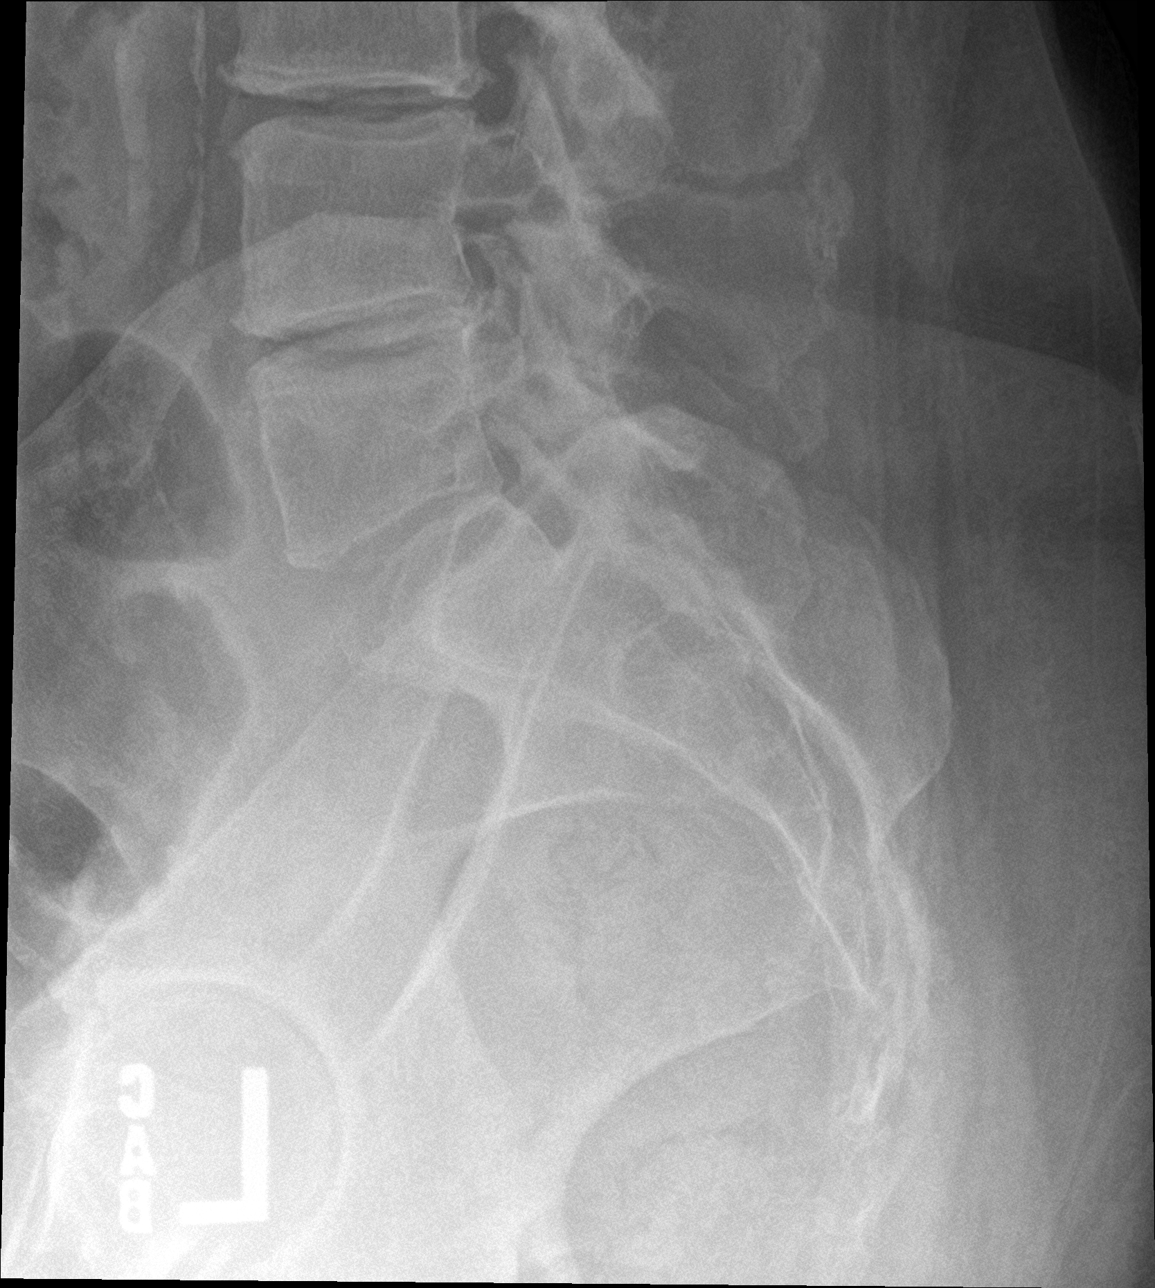

[5 of 5 positions shown; findings below may reference images not displayed]

FINDINGS: Mild lumbar spine scoliosis concave left. Diffuse multilevel
degenerative change. No acute bony abnormality. No evidence of
fracture. Aortic atherosclerotic vascular calcification.
IMPRESSION: Mild lumbar scoliosis concave left. Diffuse degenerative change. No
acute abnormality.
